# Patient Record
Sex: Female | Born: 1960 | Race: Black or African American | Hispanic: No
Health system: Southern US, Academic
[De-identification: ages and names within clinical notes are randomized; demographics above are authoritative.]

## PROBLEM LIST (undated history)

## (undated) DIAGNOSIS — I10 Essential (primary) hypertension: Secondary | ICD-10-CM

## (undated) DIAGNOSIS — E785 Hyperlipidemia, unspecified: Secondary | ICD-10-CM

## (undated) DIAGNOSIS — F411 Generalized anxiety disorder: Secondary | ICD-10-CM

## (undated) DIAGNOSIS — F32A Depression, unspecified: Secondary | ICD-10-CM

## (undated) DIAGNOSIS — I2510 Atherosclerotic heart disease of native coronary artery without angina pectoris: Secondary | ICD-10-CM

## (undated) HISTORY — DX: Depression, unspecified: F32.A

## (undated) HISTORY — DX: Essential (primary) hypertension: I10

## (undated) HISTORY — DX: Generalized anxiety disorder: F41.1

## (undated) HISTORY — PX: HX COLONOSCOPY: 2100001147

## (undated) HISTORY — DX: Hyperlipidemia, unspecified: E78.5

## (undated) HISTORY — PX: HX GALL BLADDER SURGERY/CHOLE: SHX55

## (undated) HISTORY — PX: HX ROTATOR CUFF REPAIR: SHX139

## (undated) HISTORY — PX: SPLENECTOMY, TOTAL: SHX788

## (undated) HISTORY — DX: Atherosclerotic heart disease of native coronary artery without angina pectoris: I25.10

## (undated) HISTORY — PX: HX NEPHRECTOMY: SHX65

---
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
## 2014-10-18 HISTORY — PX: HX ROTATOR CUFF REPAIR: SHX139

## 2015-07-29 HISTORY — PX: CARDIAC CATHETERIZATION: SHX172

## 2019-03-31 IMAGING — US ABD LIMITED
1 series · 14 of 25 positions shown · non-contrast
Comparison: 10/04/2014.

EXAM:  KALLUDRA [employer]ESSION[employer] READ ABD U/S LMTD
INDICATION: Abdominal pain.

[Series 1: abd limited · 14 of 49 slices shown]
[im 1/49]
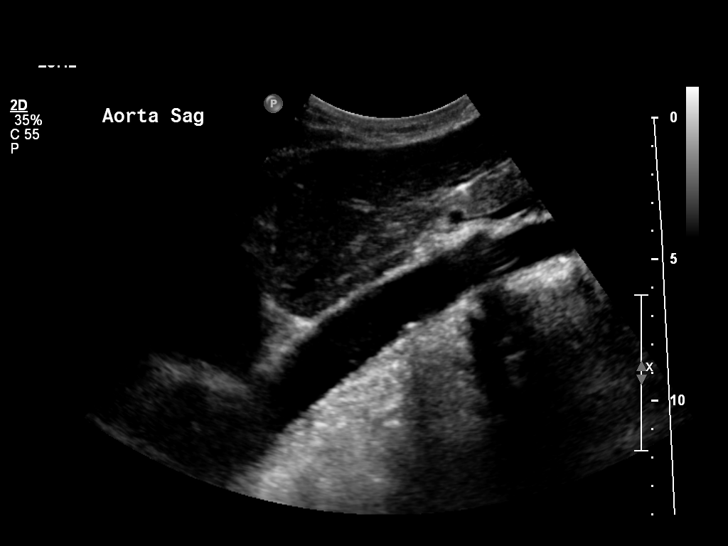
[im 5/49]
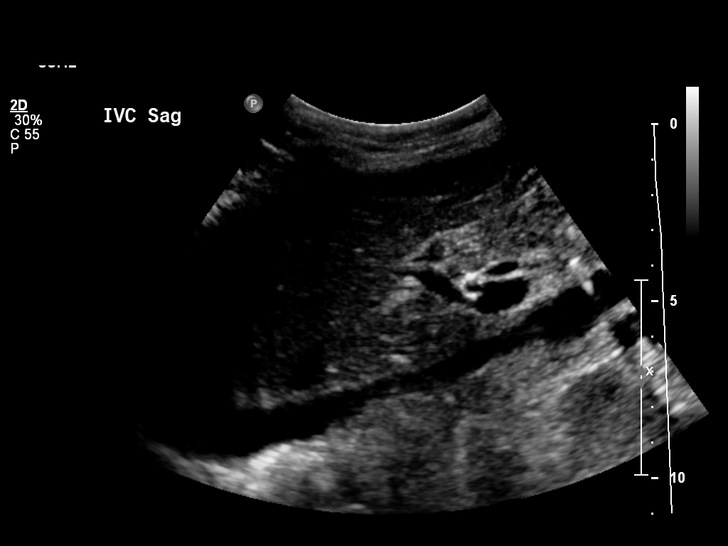
[im 9/49]
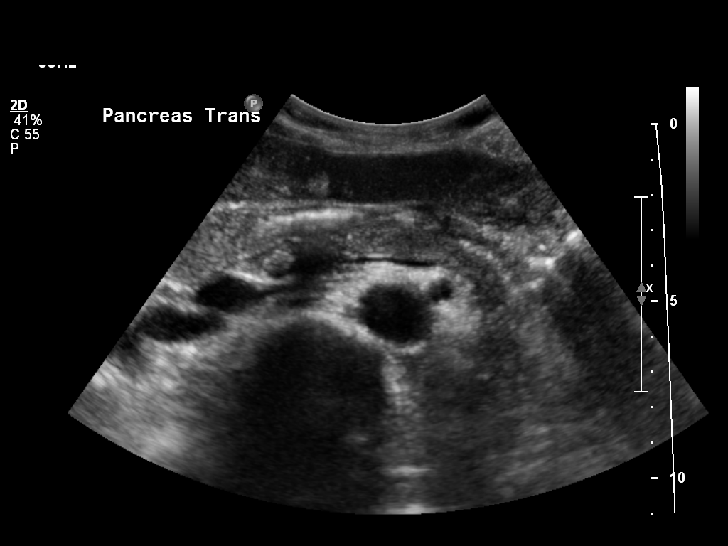
[im 13/49]
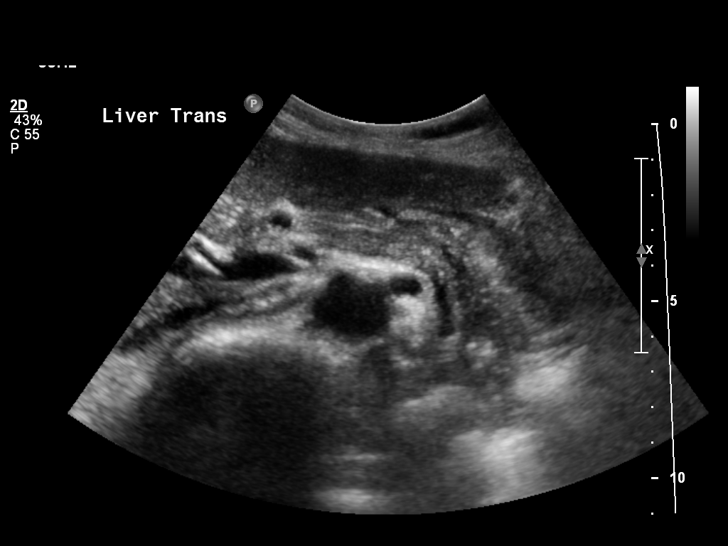
[im 17/49]
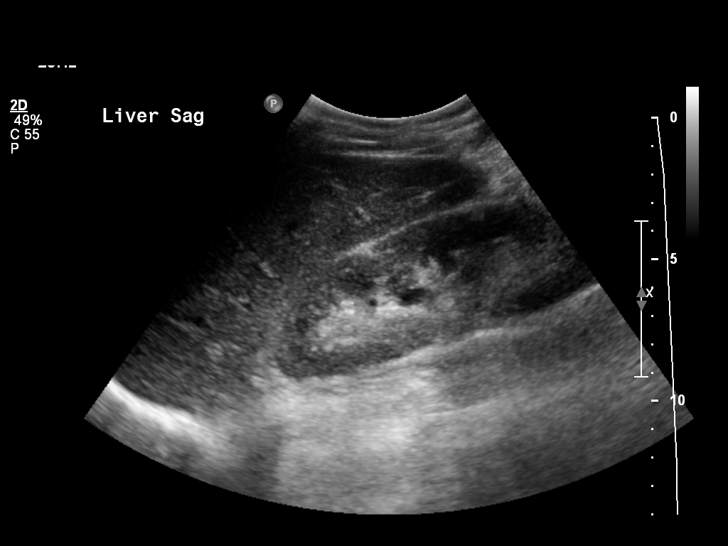
[im 19/49]
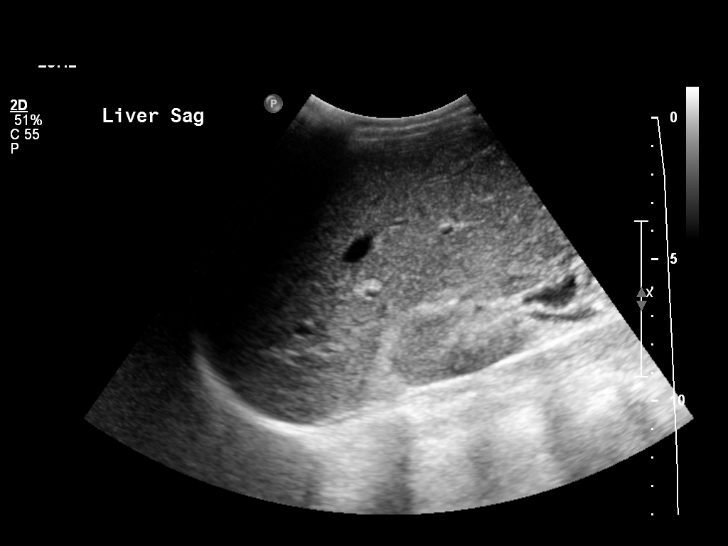
[im 23/49]
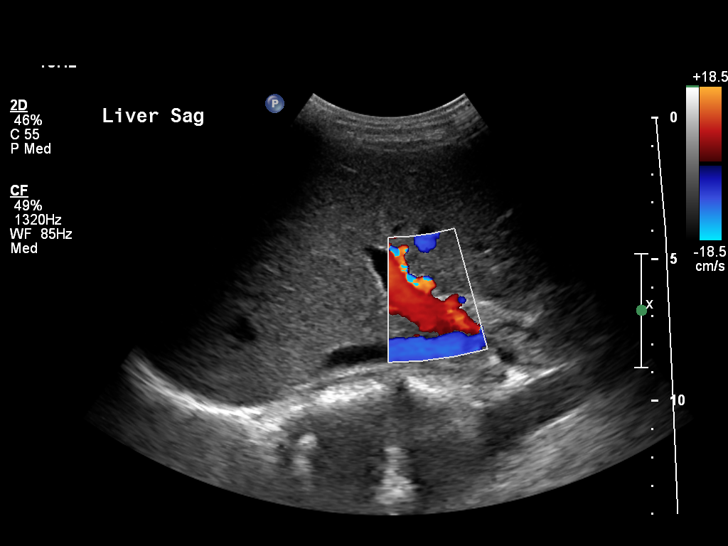
[im 27/49]
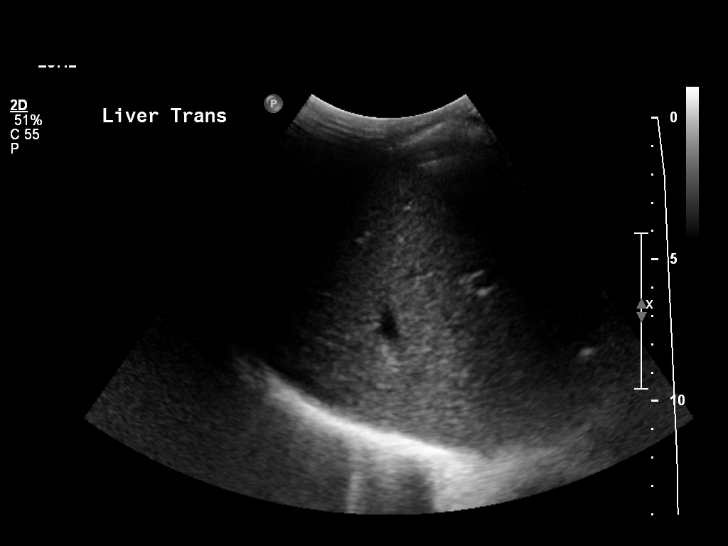
[im 31/49]
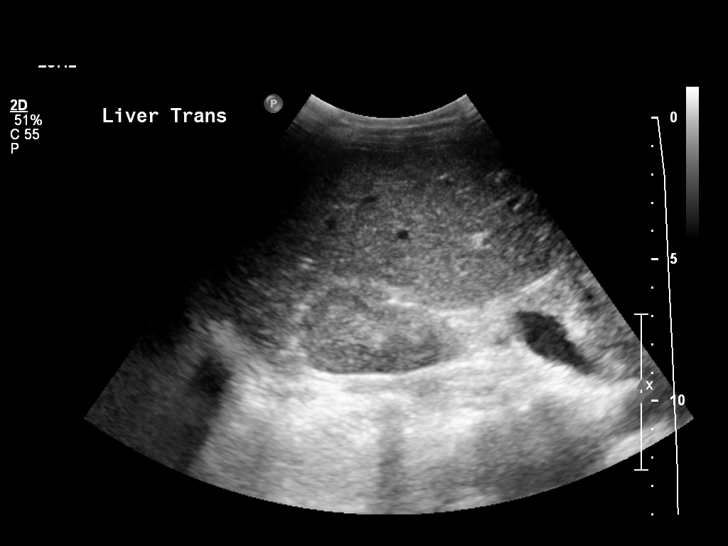
[im 33/49]
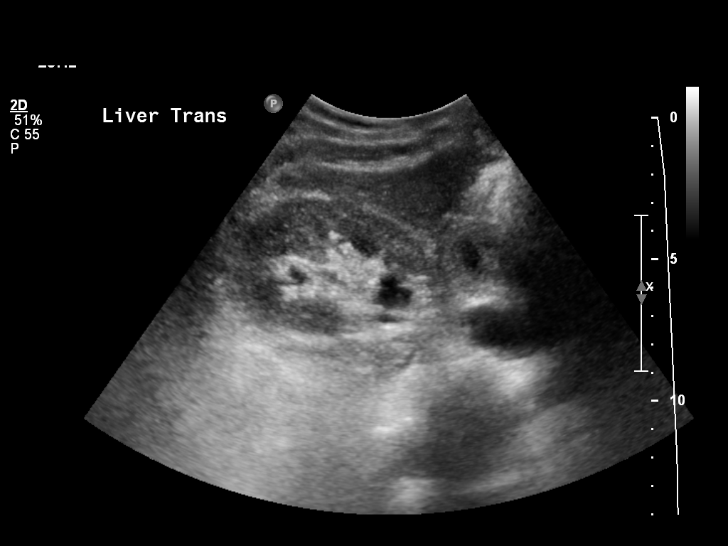
[im 37/49]
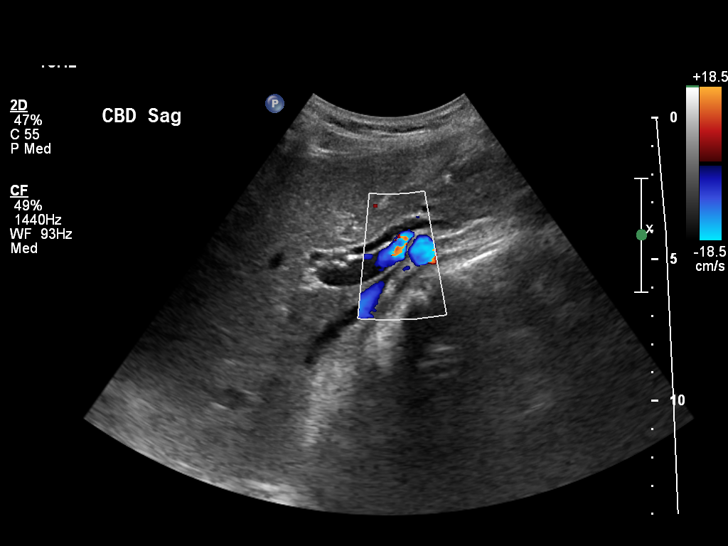
[im 41/49]
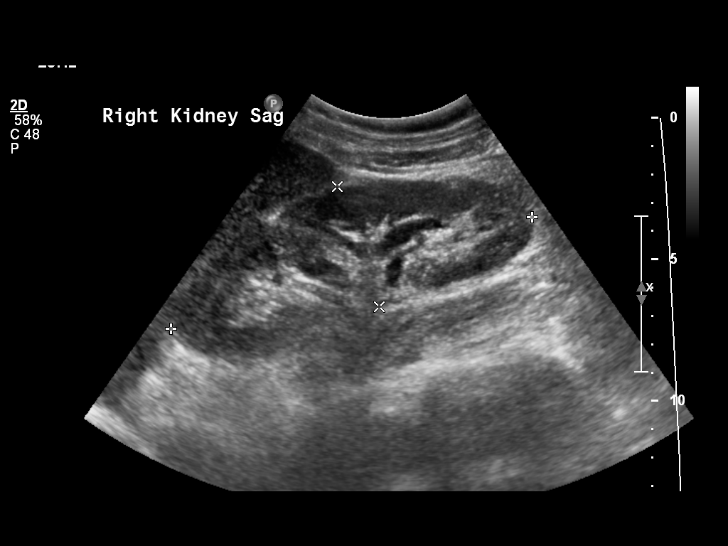
[im 45/49]
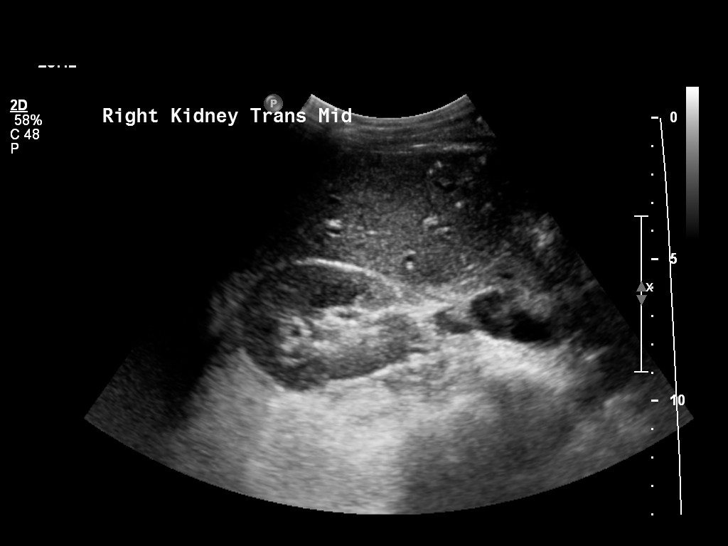
[im 49/49]
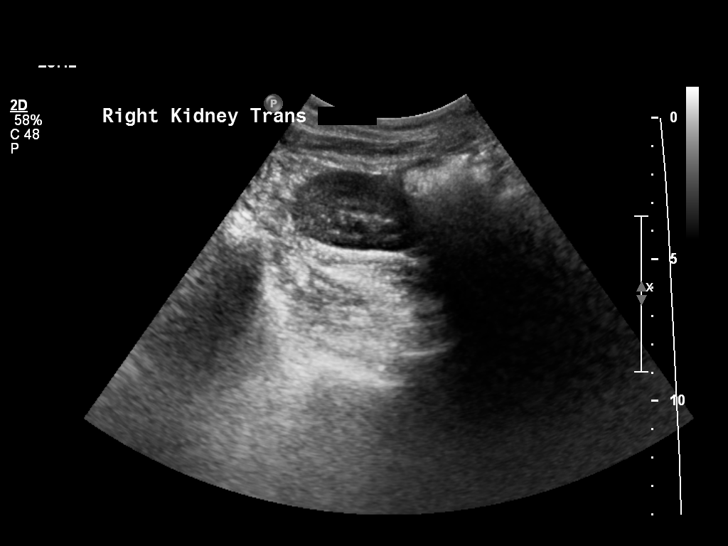

[14 of 25 positions shown; findings below may reference images not displayed]

FINDINGS: Liver is normal in echogenicity. There is no hepatic mass. There is no intra or extrahepatic biliary ductal dilatation. Common bile duct measures 3 mm. Gallbladder is surgically absent. Pancreas is unremarkable. Right kidney measures 13.5 cm and is normal.

Visualized abdominal aorta is without aneurysmal dilatation. IVC is normal. Portal vein measures 10 mm in diameter and demonstrates hepatopetal flow. Hepatic veins are also patent. There is no ascites.
IMPRESSION: 1. Unremarkable liver. 

2. Prior cholecystectomy.

## 2019-04-27 IMAGING — CR XRAY THORACIC SPINE 2 VIEWS
1 series · 3 of 3 positions shown · non-contrast
Comparison: None available.

EXAM:  XRAY THORACIC SPINE 2 VIEWS,

EXAM:  XRAY LUMBAR SPINE [DATE] VIEWS
INDICATION: Back pain.

[Series 1: view not recorded · 0.17mm/px · 3 of 3 slices shown]
[im 1/3]
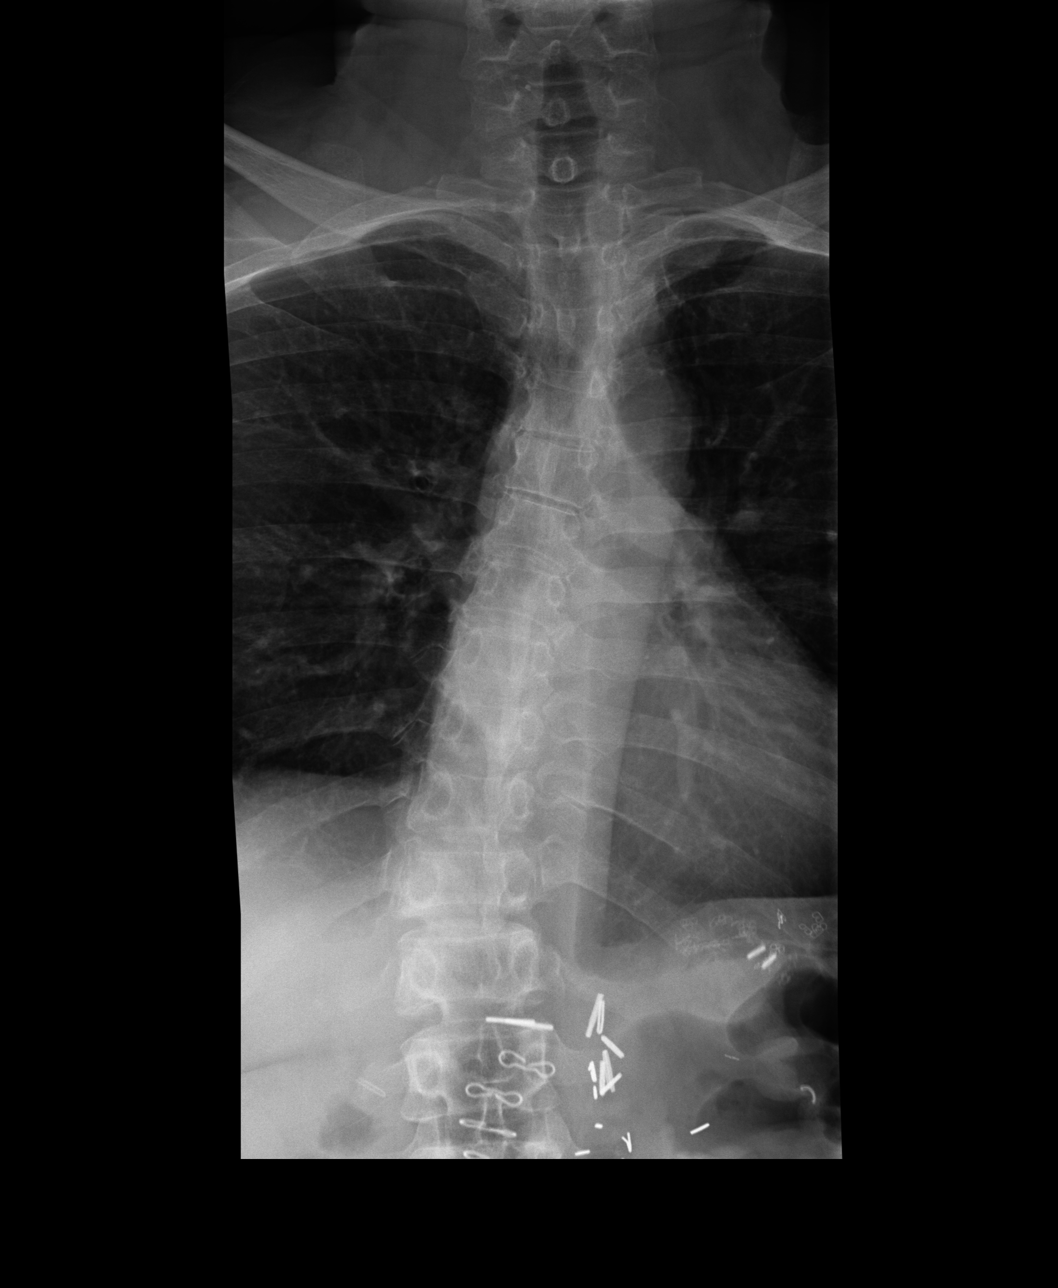
[im 2/3]
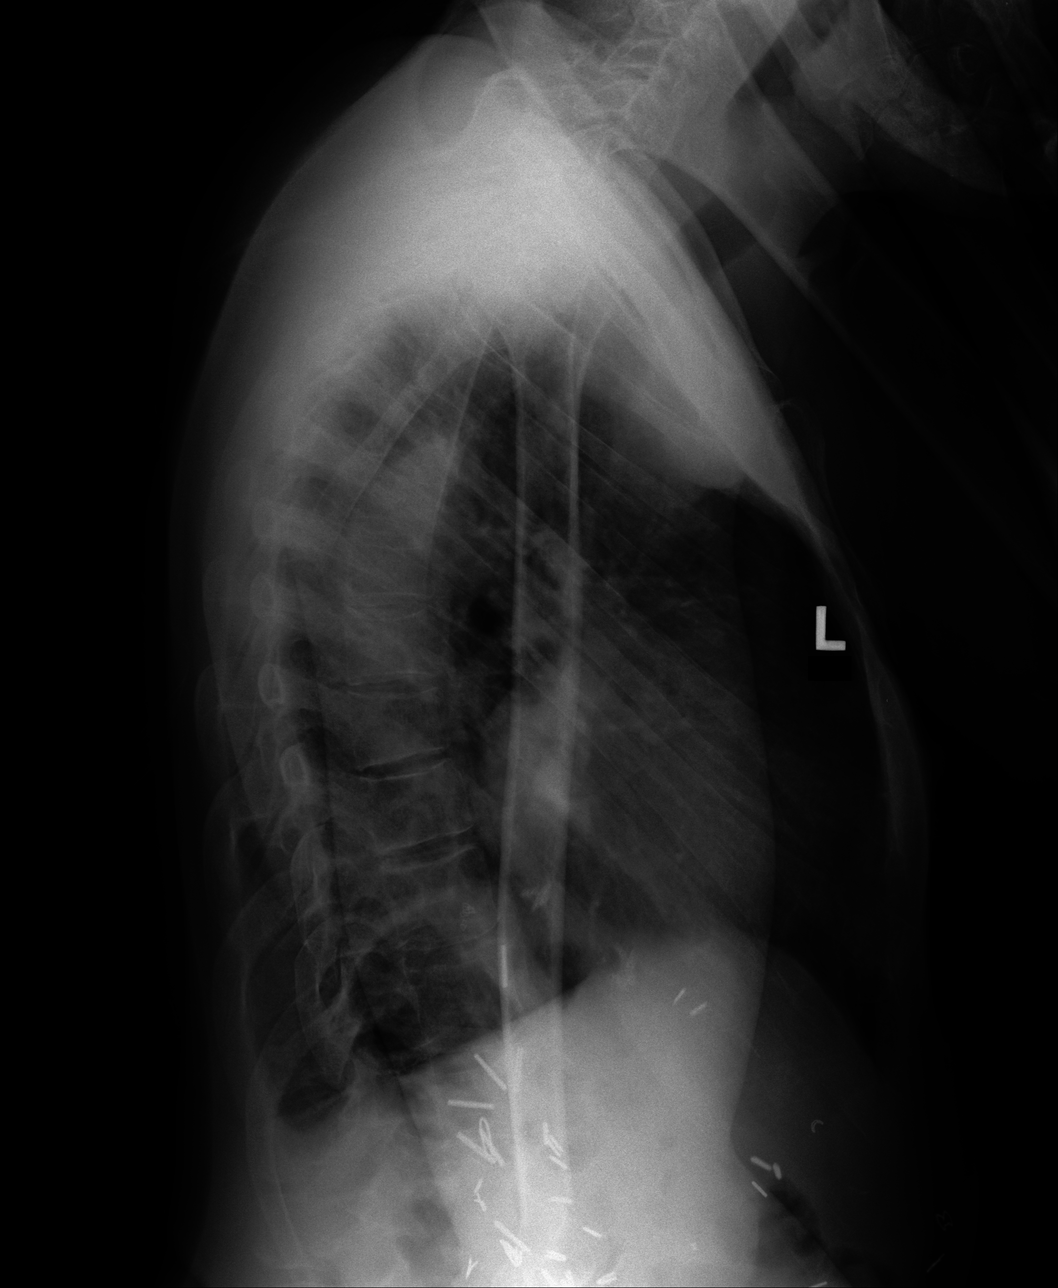
[im 3/3]
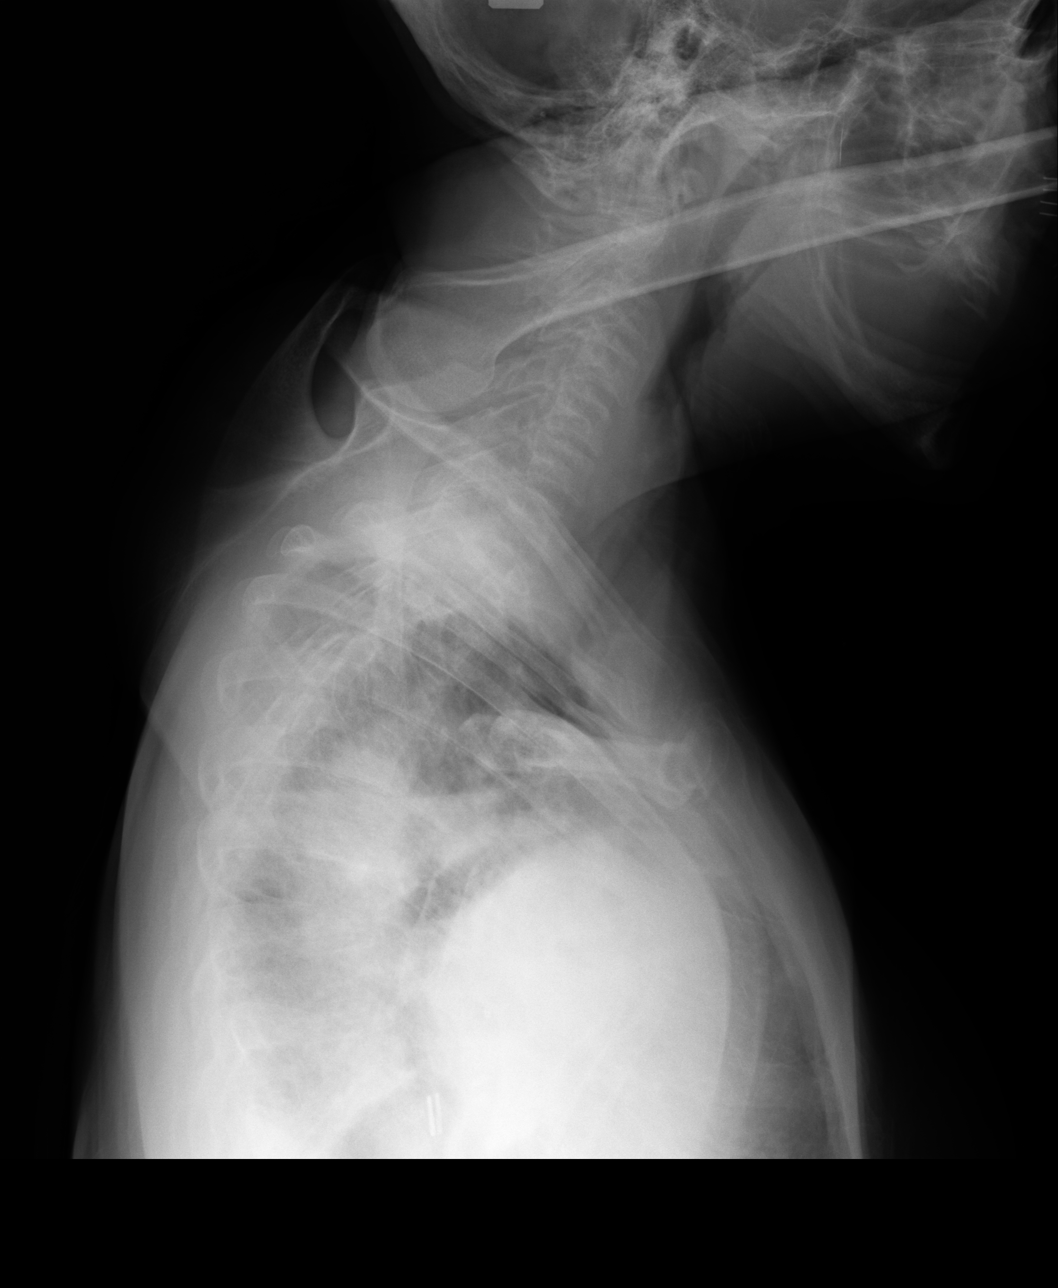

[3 of 3 positions shown; findings below may reference images not displayed]

FINDINGS: There is a mild dextroscoliosis centered at the thoracolumbar junction. No acute fracture or subluxation is seen. There are no significant arthritic changes within the thoracic spine. There is moderate degenerative disc disease at L5-S1 level. Minimal anterolisthesis of L4 on L5 vertebral body is also likely degenerative. There is also moderate facet arthropathy within the lower lumbar spine. Numerous surgical staples are seen throughout the abdomen.
IMPRESSION: Mild dextroscoliosis centered at the thoracolumbar junction. 

Multilevel degenerative changes of the lumbar spine as detailed above. 

No significant degenerative changes of the thoracic spine.

## 2019-04-27 IMAGING — CR XRAY LUMBAR SPINE [DATE] VIEWS
1 series · 3 of 3 positions shown · non-contrast
Comparison: None available.

EXAM:  XRAY THORACIC SPINE 2 VIEWS,

EXAM:  XRAY LUMBAR SPINE [DATE] VIEWS
INDICATION: Back pain.

[Series 3: view not recorded · 0.17mm/px · 3 of 3 slices shown]
[im 1/3]
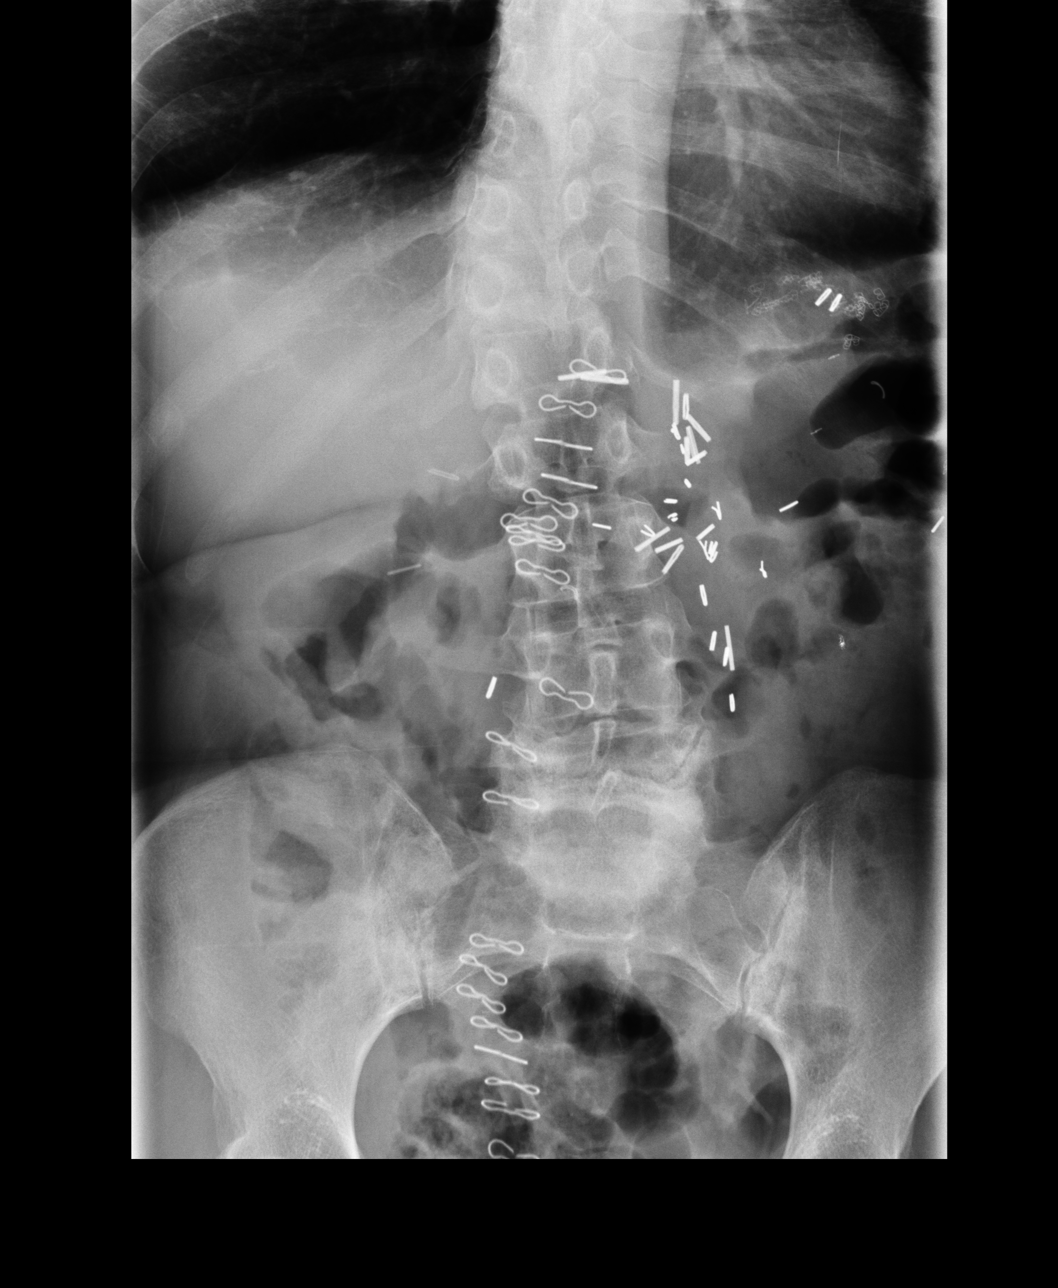
[im 2/3]
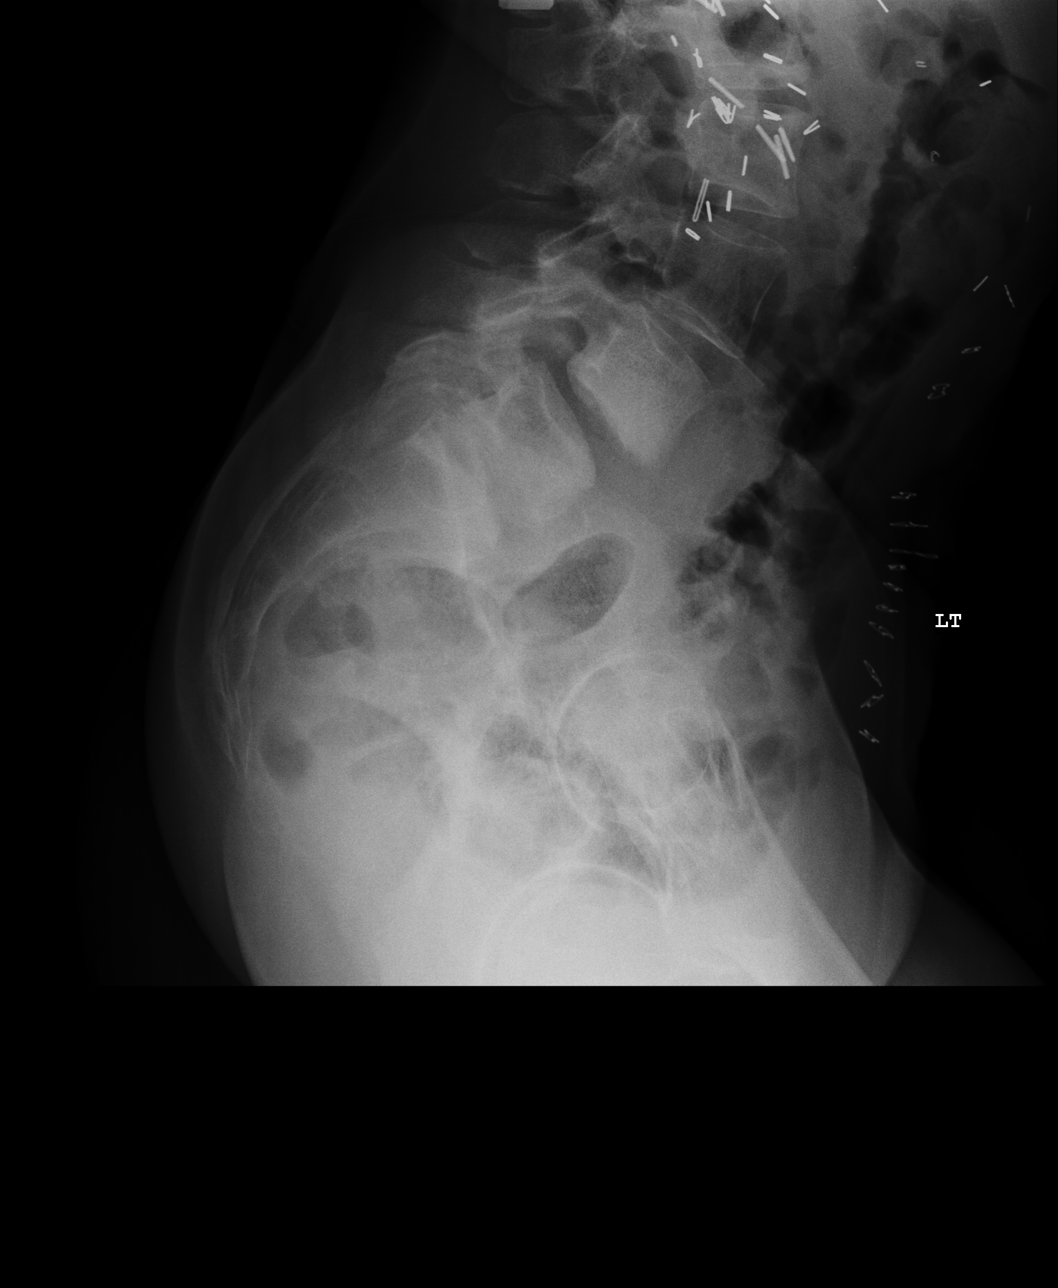
[im 3/3]
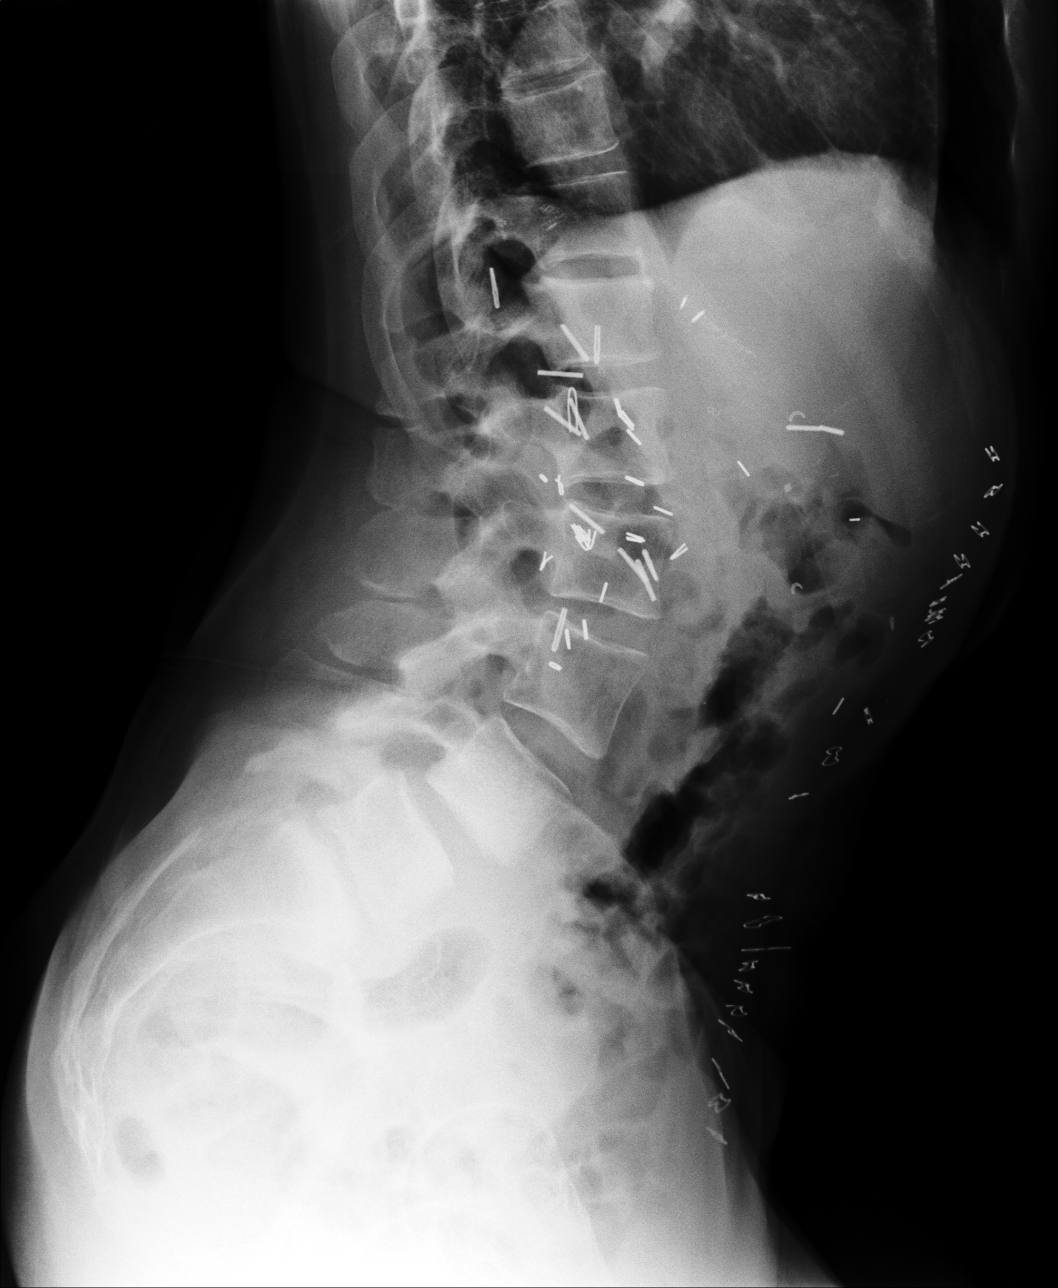

[3 of 3 positions shown; findings below may reference images not displayed]

FINDINGS: There is a mild dextroscoliosis centered at the thoracolumbar junction. No acute fracture or subluxation is seen. There are no significant arthritic changes within the thoracic spine. There is moderate degenerative disc disease at L5-S1 level. Minimal anterolisthesis of L4 on L5 vertebral body is also likely degenerative. There is also moderate facet arthropathy within the lower lumbar spine. Numerous surgical staples are seen throughout the abdomen.
IMPRESSION: Mild dextroscoliosis centered at the thoracolumbar junction. 

Multilevel degenerative changes of the lumbar spine as detailed above. 

No significant degenerative changes of the thoracic spine.

## 2019-06-28 IMAGING — CR XRAY LUMBAR SPINE COMPLETE
1 series · 3 of 3 positions shown · non-contrast
Comparison: Radiographs dated 04/27/2019.

EXAM:  XRAY LUMBAR SPINE COMPLETE
INDICATION: Lower back pain with radiculopathy.

[Series 2: view not recorded · 0.17mm/px · 3 of 3 slices shown]
[im 1/3]
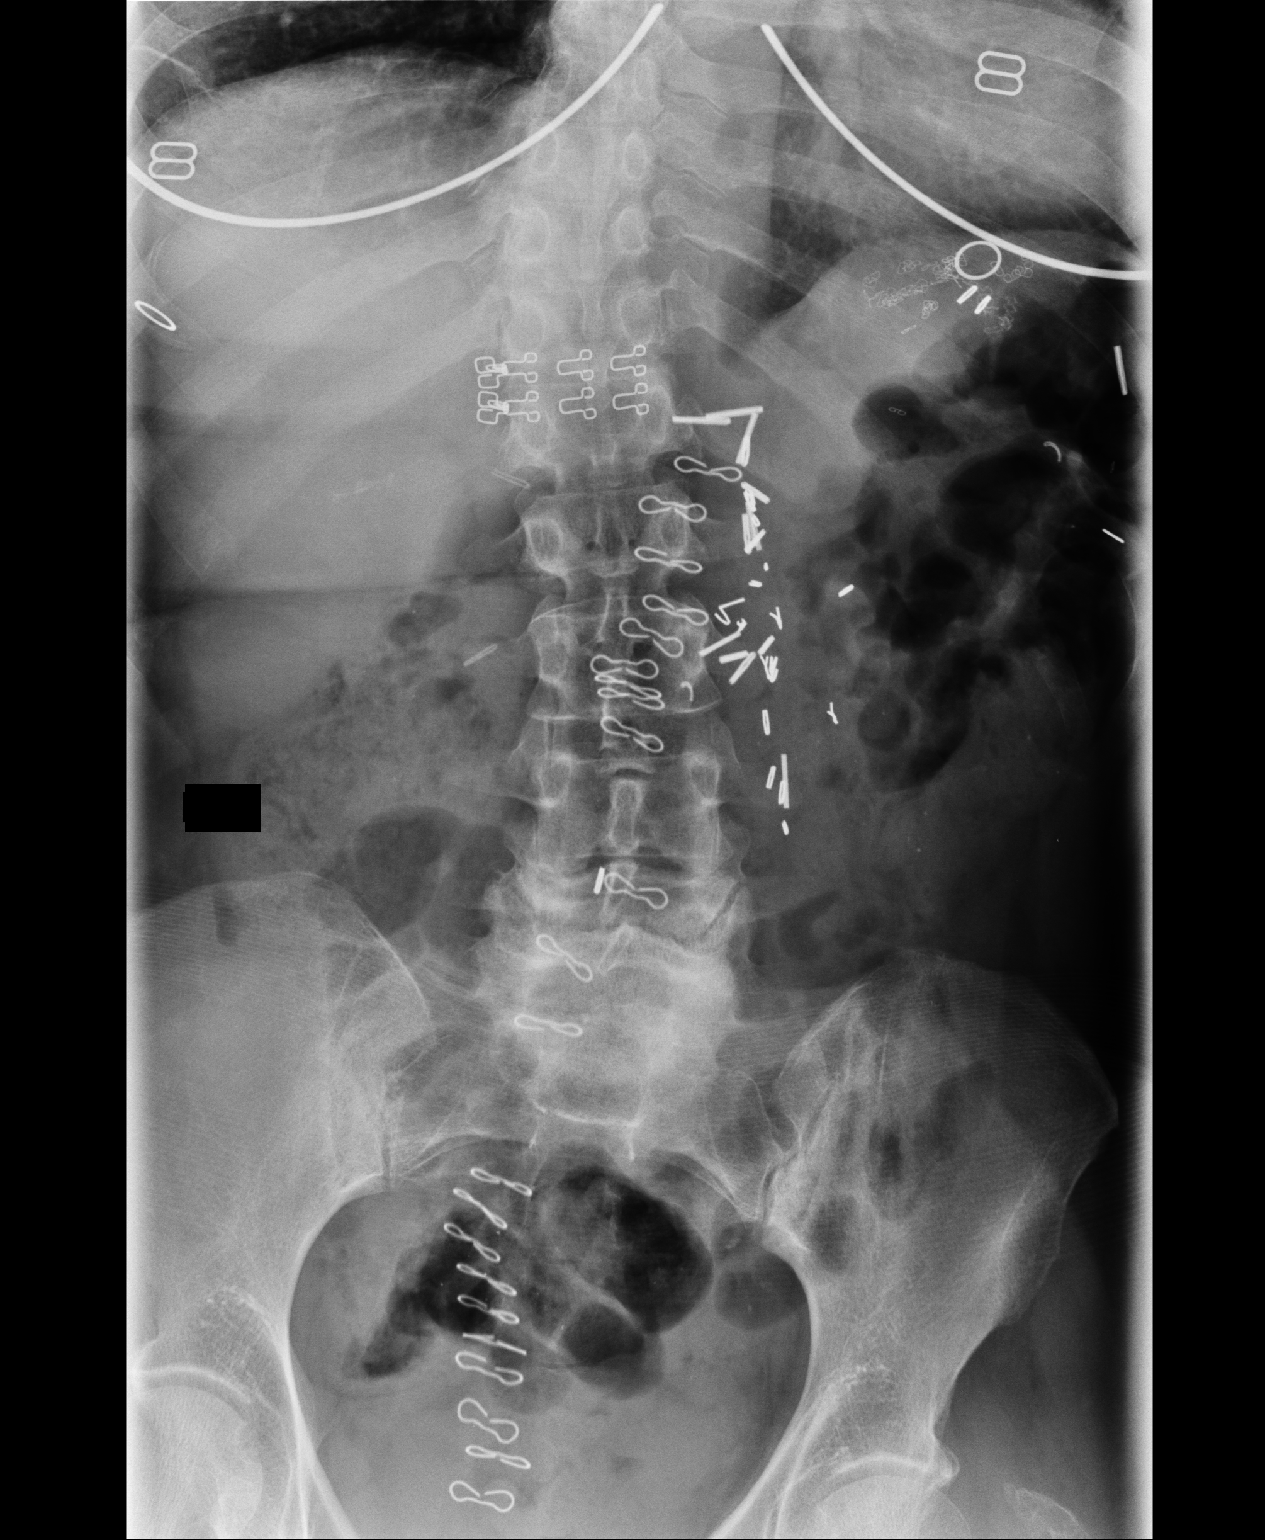
[im 2/3]
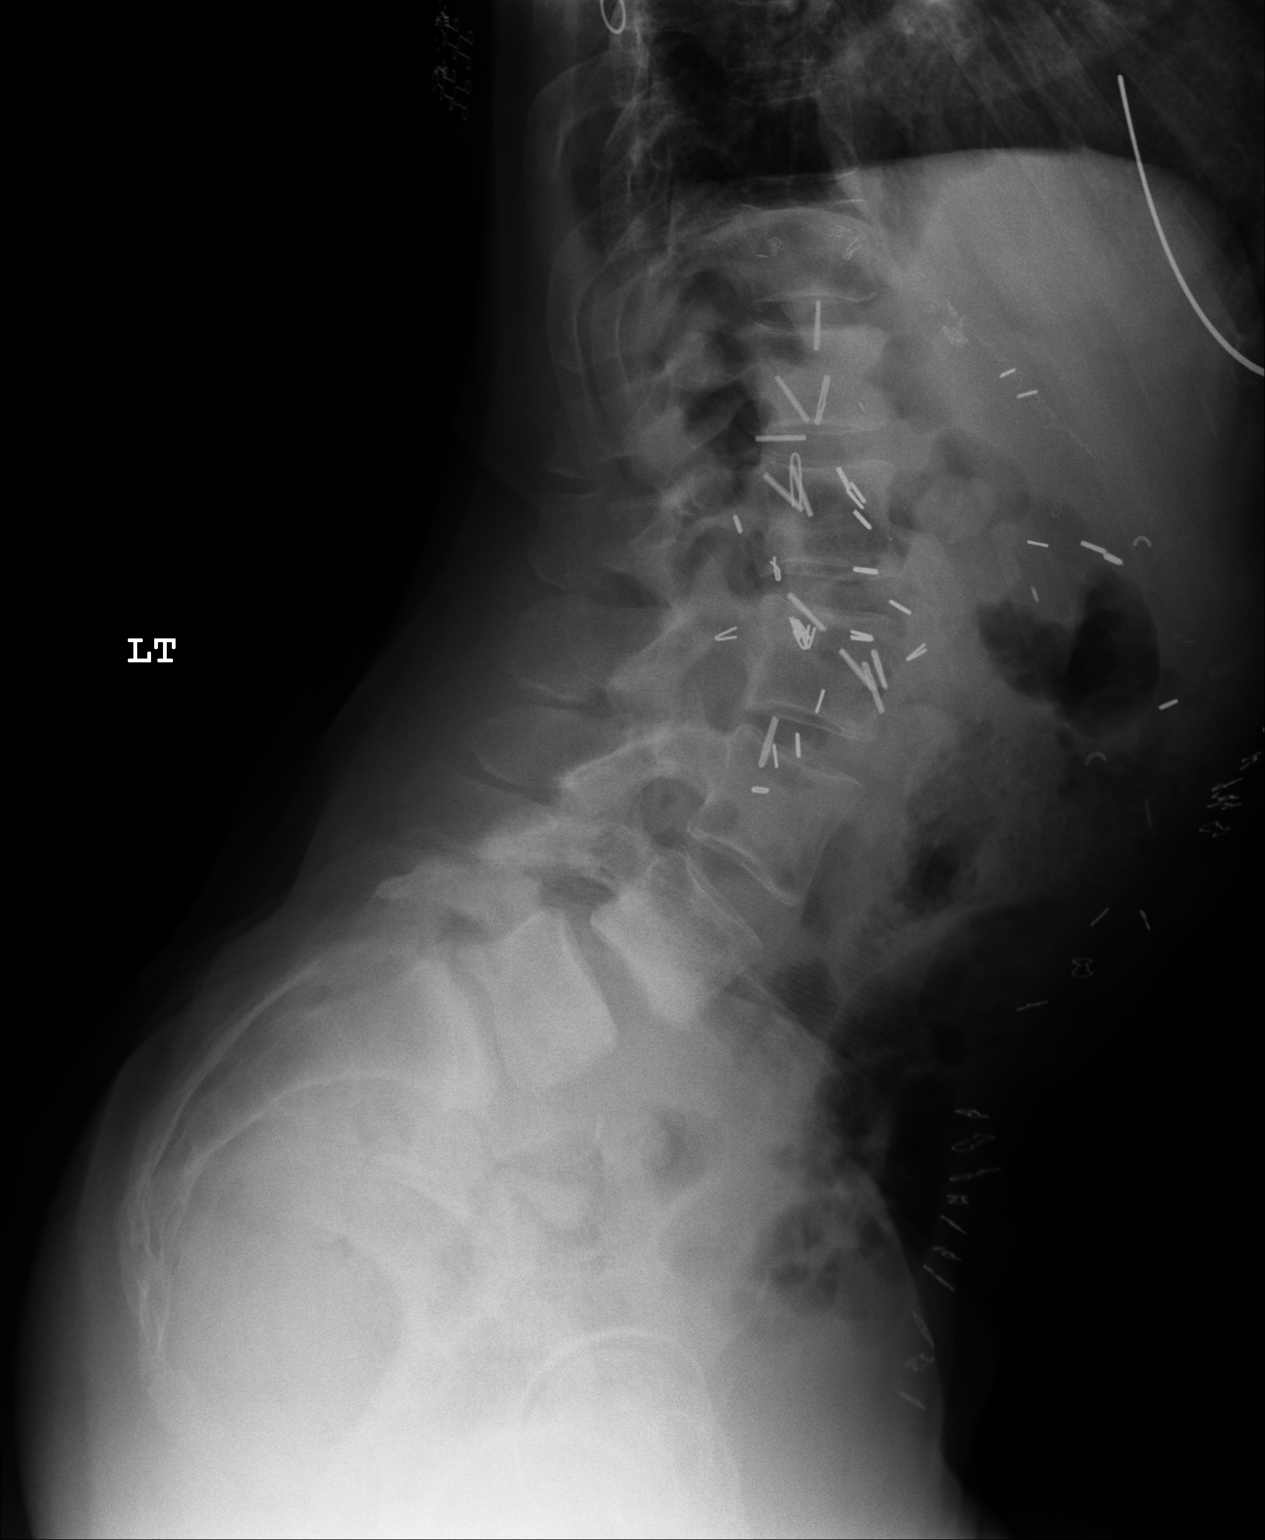
[im 3/3]
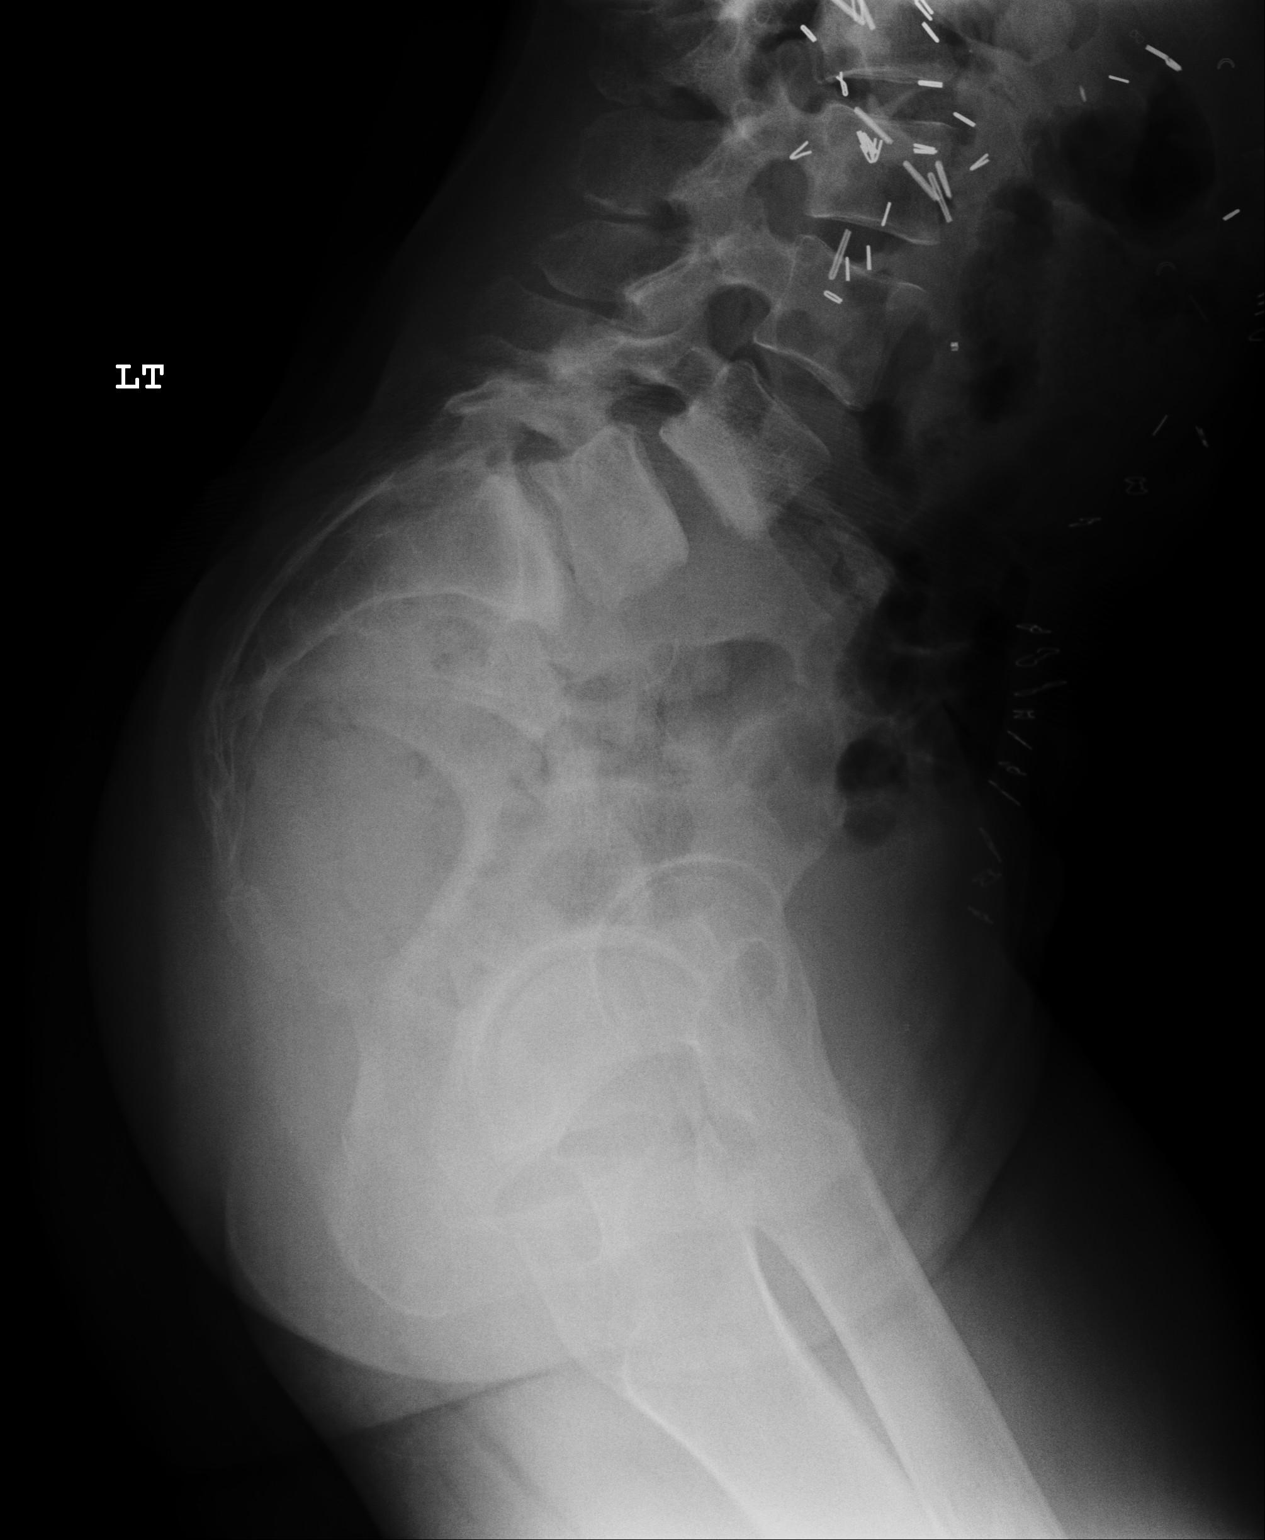

[3 of 3 positions shown; findings below may reference images not displayed]

FINDINGS: There is no acute fracture or subluxation. Moderate degenerative disc disease at L5-S1 level is again noted. Minimal anterolisthesis of L4 on L5 vertebral body is also stable. Moderate facet arthropathy within the lower lumbar spine is also again seen. Paraspinal soft tissues are unremarkable. Extensive postsurgical changes of the abdomen are again identified.
IMPRESSION: Stable degenerative changes as detailed above.

## 2019-06-28 IMAGING — CR XRAY HIP W/PELVIS UNIL 2-3 VIEWS
1 series · 2 of 2 positions shown · non-contrast
Comparison: None available.

EXAM:  XRAY HIP W/PELVIS UNIL 2-3 VIEWS
INDICATION: Pelvic and right hip pain.

[Series 1: view not recorded · 0.17mm/px · 2 of 2 slices shown]
[im 1/2]
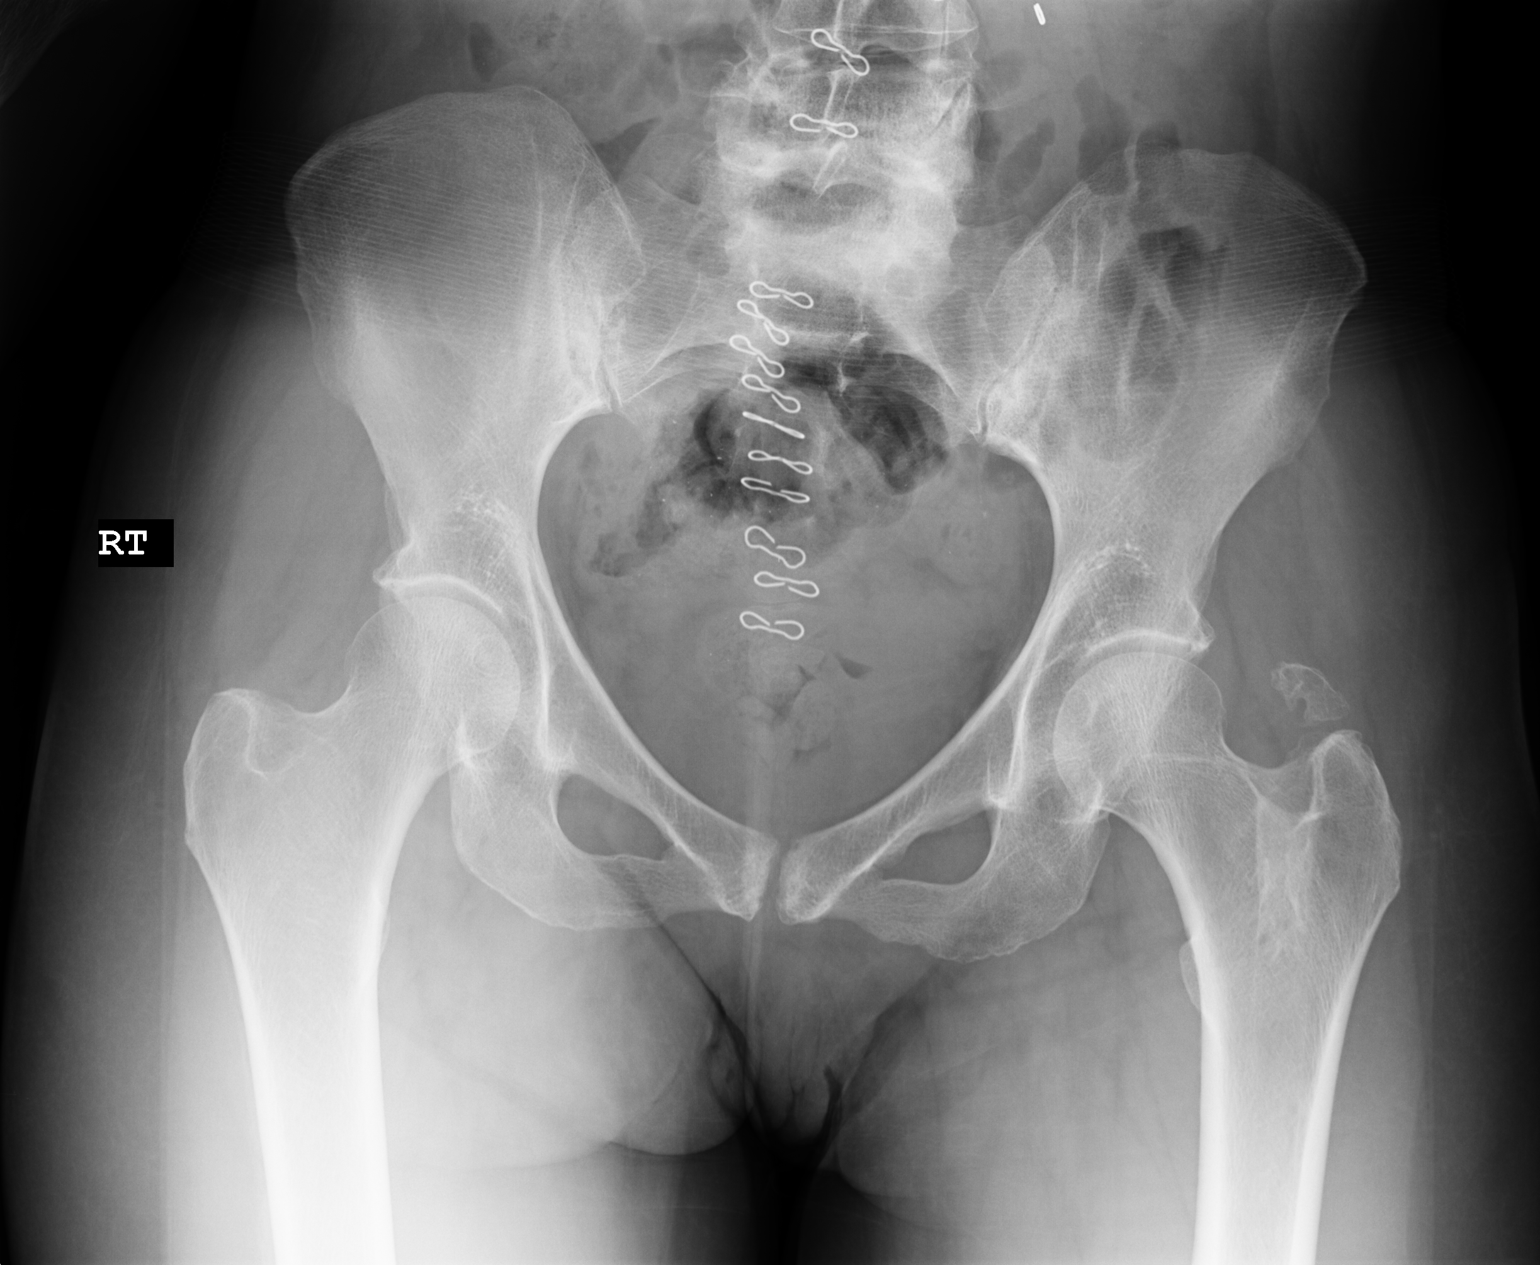
[im 2/2]
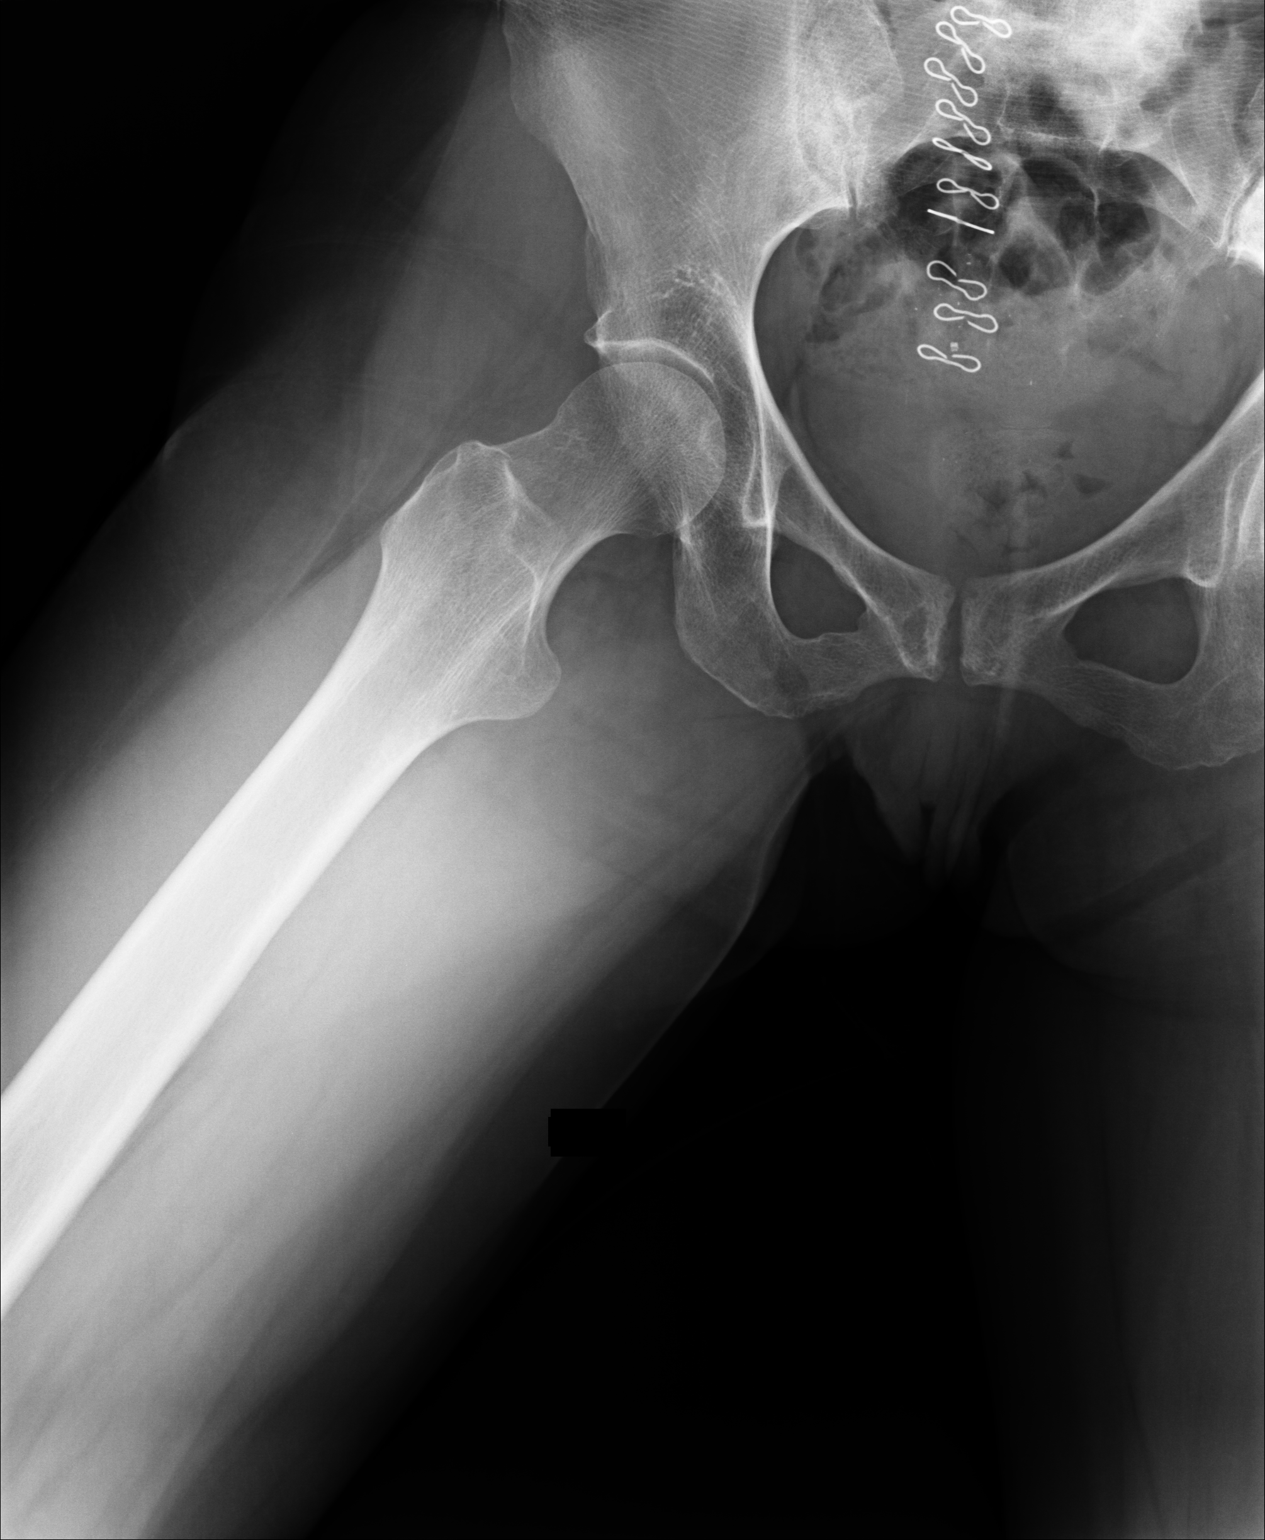

[2 of 2 positions shown; findings below may reference images not displayed]

FINDINGS: There is no acute fracture or subluxation. No significant arthritic changes seen within the hip joints. Well corticated calcification adjacent to the left greater trochanter is likely from remote trauma. Surrounding soft tissues are unremarkable.
IMPRESSION: Unremarkable exam.

## 2020-08-08 IMAGING — MG 2D SCREENING MAMMO BIL W/CAD
1 series · 4 of 4 positions shown · non-contrast
Comparison: 06/03/2020 and 12/15/2018.

------------- REPORT [id number] -------------
[facility] [place]
[address]
Daina Ms.TOLMAY, MMAKWENA:
We wish to report the following on your recent mammography examination. We are sending a report to your referring physician or other health care provider. 
(       Normal/Negative:
No evidence of cancer.
This statement is mandated by the [place], Department of Health.
Your examination was performed by one of our technologists, who are registered radiological technologists and also specially certified in mammography:
___
Parlak, Edaly (M)
Nepomuceno, Martinez (M)

Your mammogram was interpreted by our radiologist.
( 
Sofeine Made, M.D.
(Annual Breast Examination by a physician or other health care provider
(Annual Mammography Screening beginning at age 40
(Monthly Breast Self Examination
------------- REPORT [id number] -------------
﻿
                         #:
EXAM:  2D BILATERAL ANNUAL SCREENING DIGITAL MAMMOGRAM WITH CAD
INDICATION: Screening.

[R CC · right · 4 of 4 slices shown]
[im 1/4]
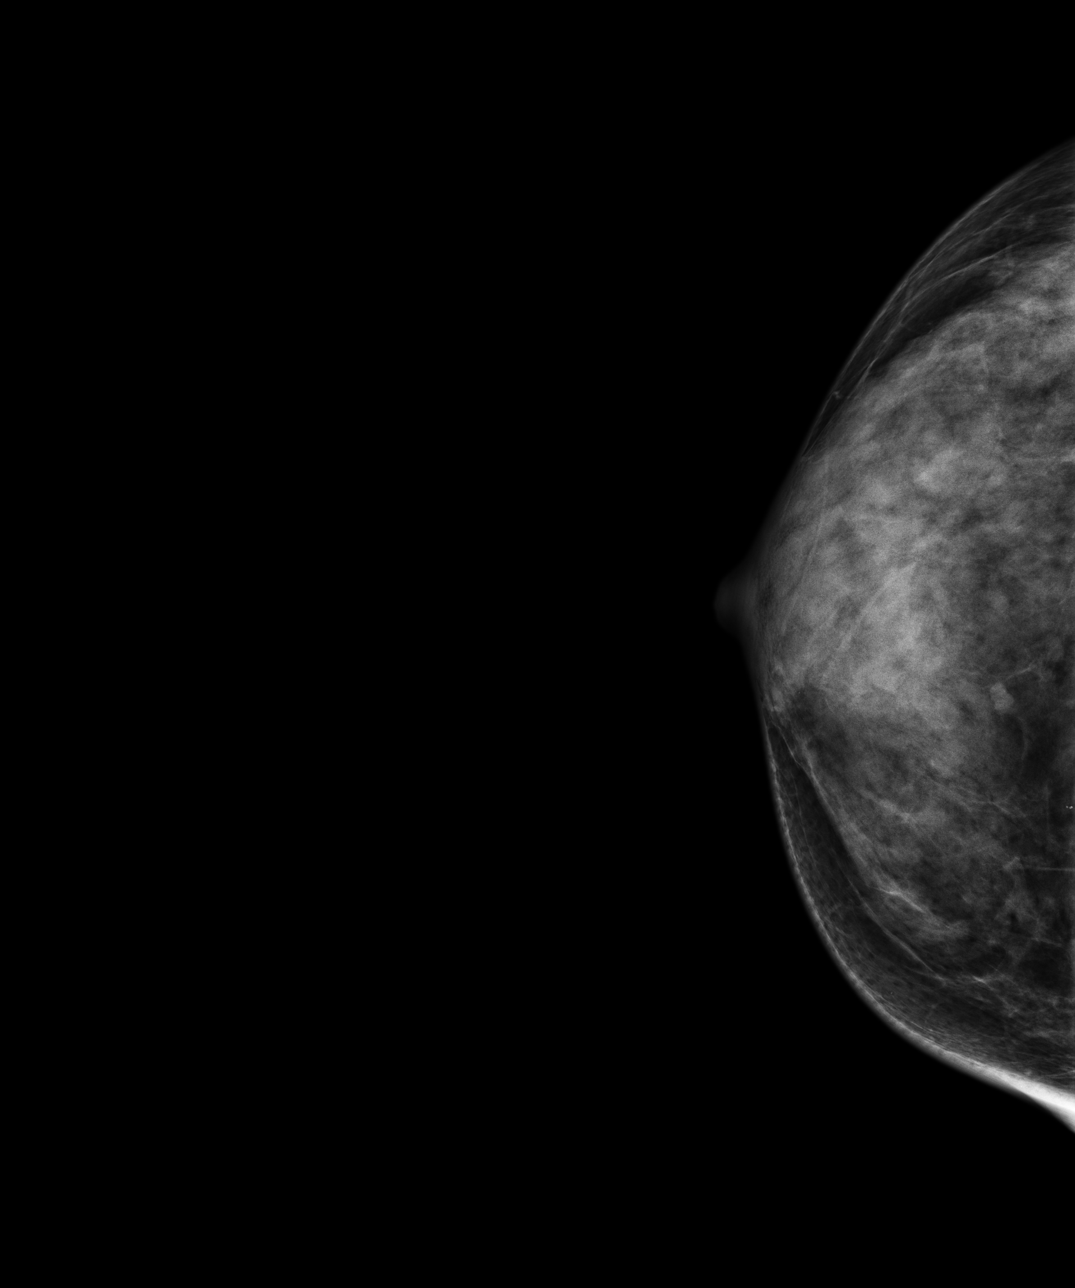
[im 2/4]
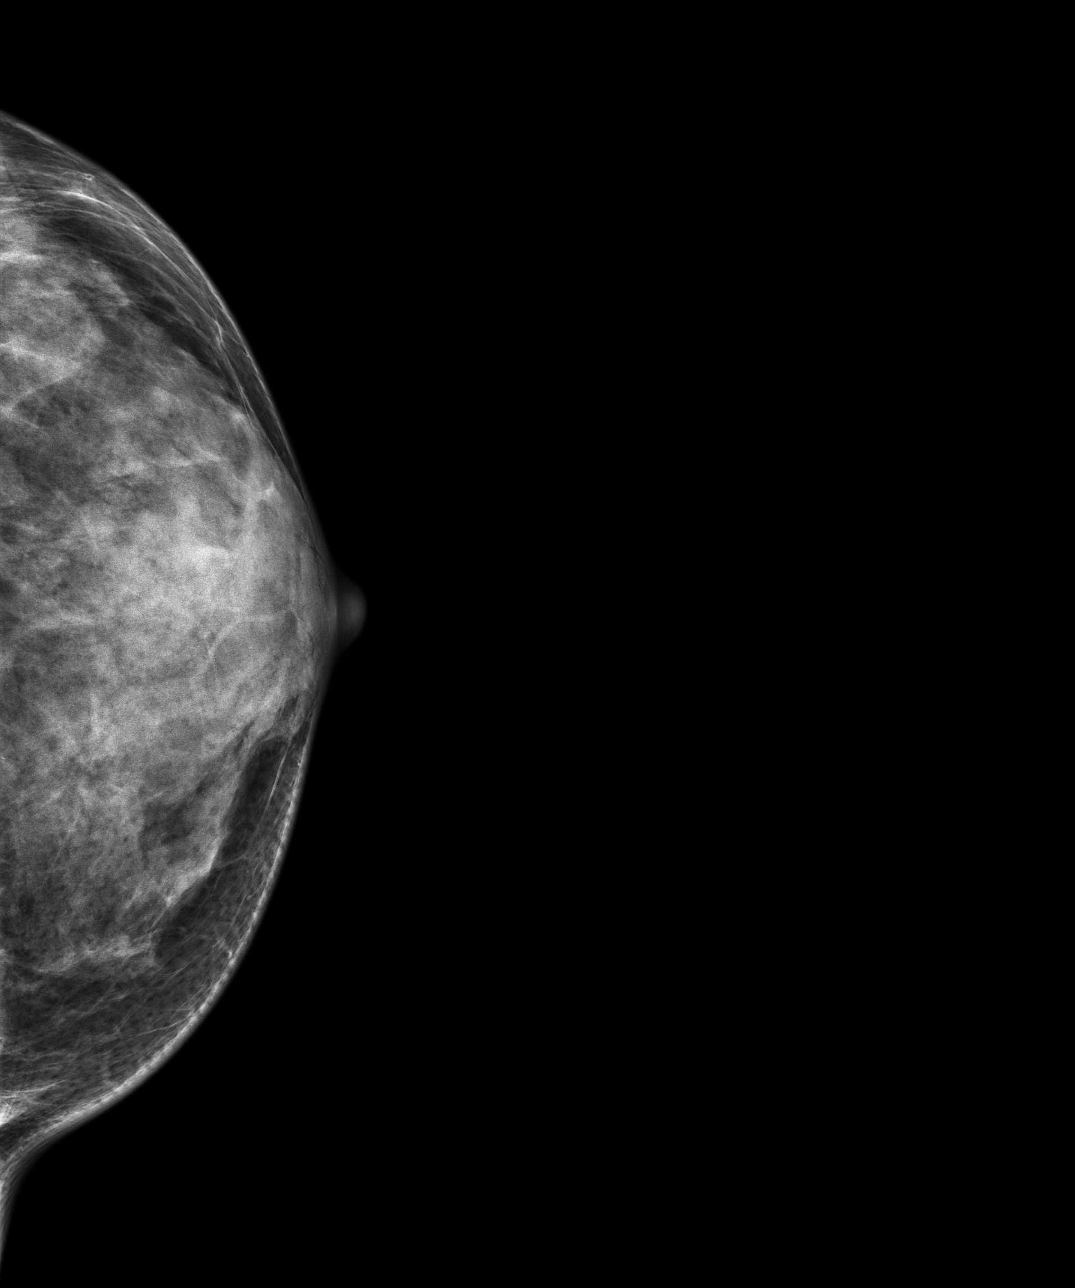
[im 3/4]
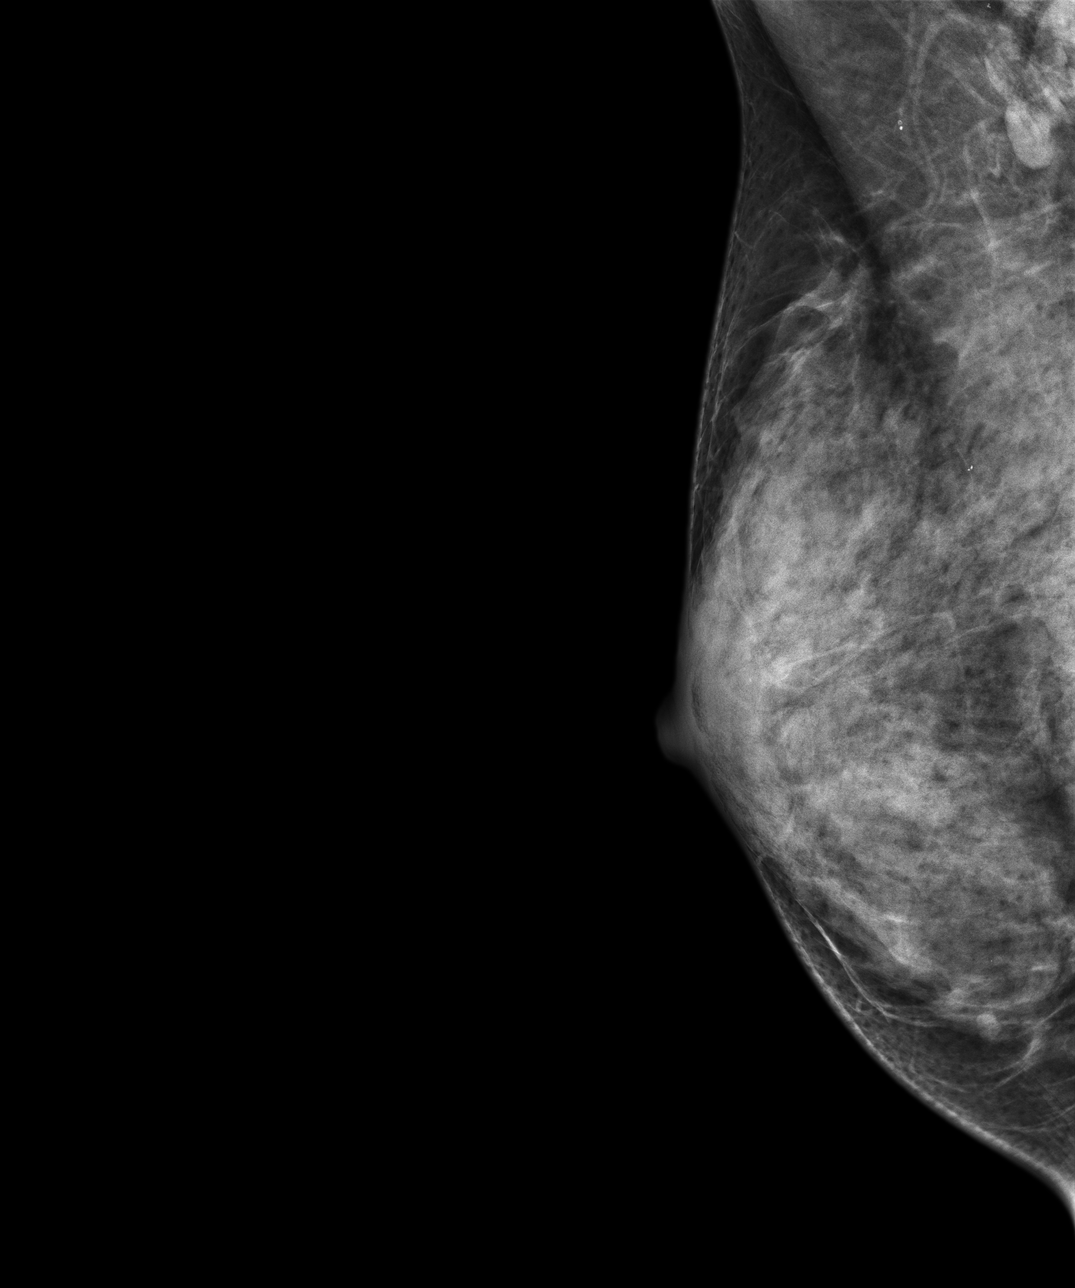
[im 4/4]
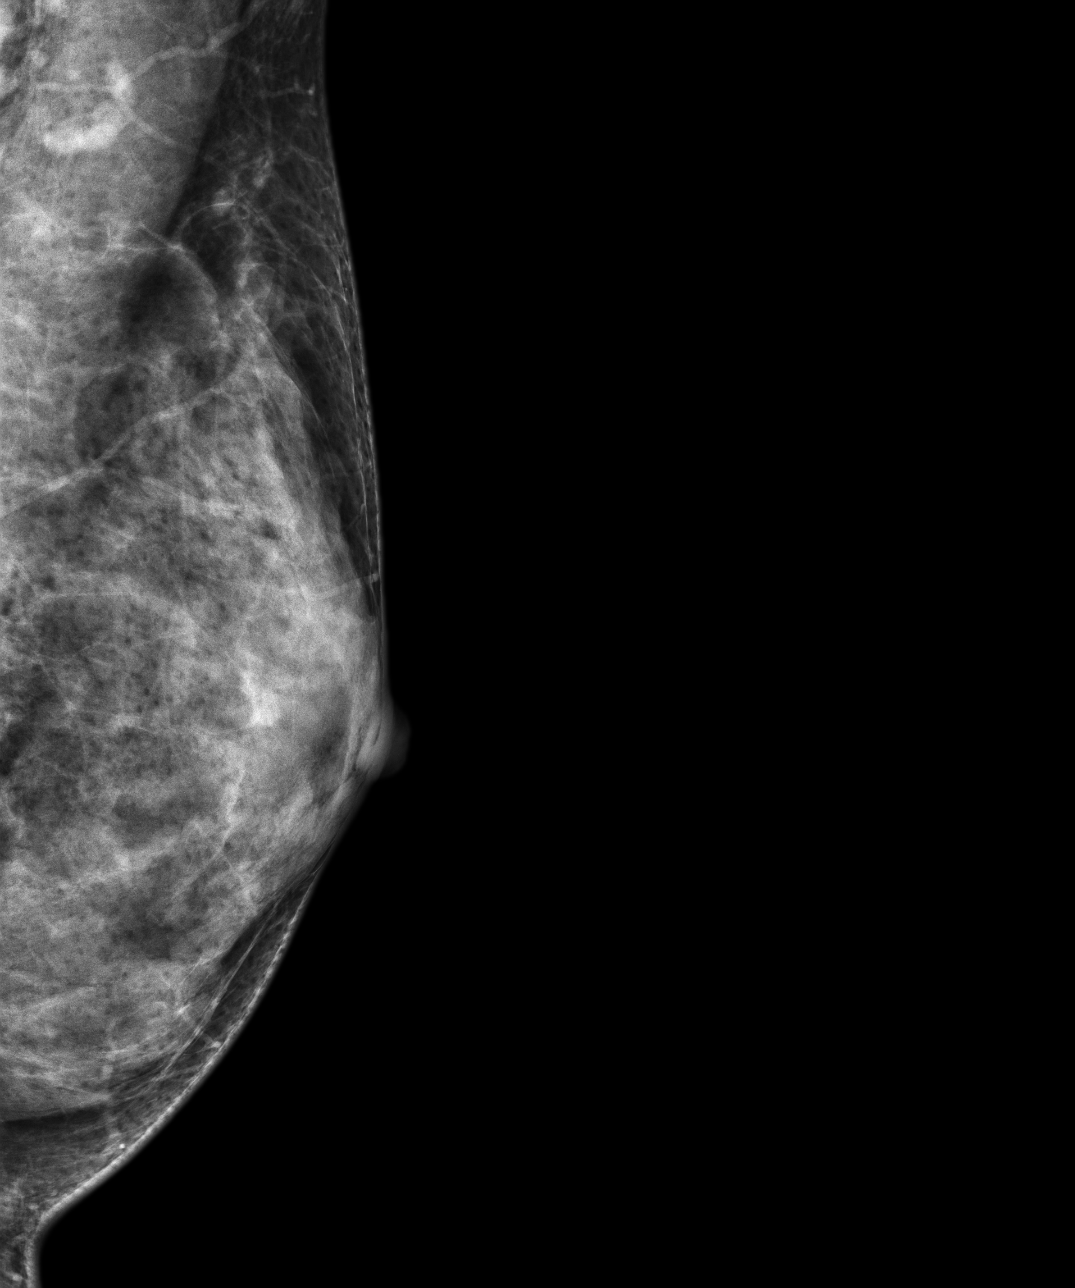

[4 of 4 positions shown; findings below may reference images not displayed]

FINDINGS: Breast parenchyma is extremely dense.  There is no mass or suspicious cluster of microcalcifications.  There is no architectural distortion, skin thickening or nipple retraction.
IMPRESSION: 1.  BIRADS 2-Benign findings. Patient has been added in a reminder system with a target date for the next screening mammography.

2.  DENSITY CODE – D (Extremely dense). 

Final Assessment Code:

Bi-Rads 2 

BI-RADS 0
Need additional imaging evaluation.

BI-RADS 1
Negative mammogram.

BI-RADS 2
Benign finding.

BI-RADS 3
Probably benign finding; short-interval follow-up suggested.

BI-RADS 4
Suspicious abnormality; biopsy should be considered.

BI-RADS 5
Highly suggestive of malignancy; appropriate action should be taken.

BI-RADS 6
Known biopsy-proven malignancy; appropriate action should be taken.

NOTE:
In compliance with Federal regulations, the results of this mammogram are being sent to the patient.

## 2021-08-11 IMAGING — MG 2D SCREENING MAMMO BIL W/CAD
1 series · 4 of 4 positions shown · non-contrast
Comparison: 07/14/2021

------------- REPORT [id number] -------------
﻿

BOAT, DAMAREO
EXAM:  2D BILATERAL ANNUAL SCREENING DIGITAL MAMMOGRAM WITH CAD
INDICATION: Screening.

[R CC · right · 0.10mm/px · 4 of 4 slices shown]
[im 1/4]
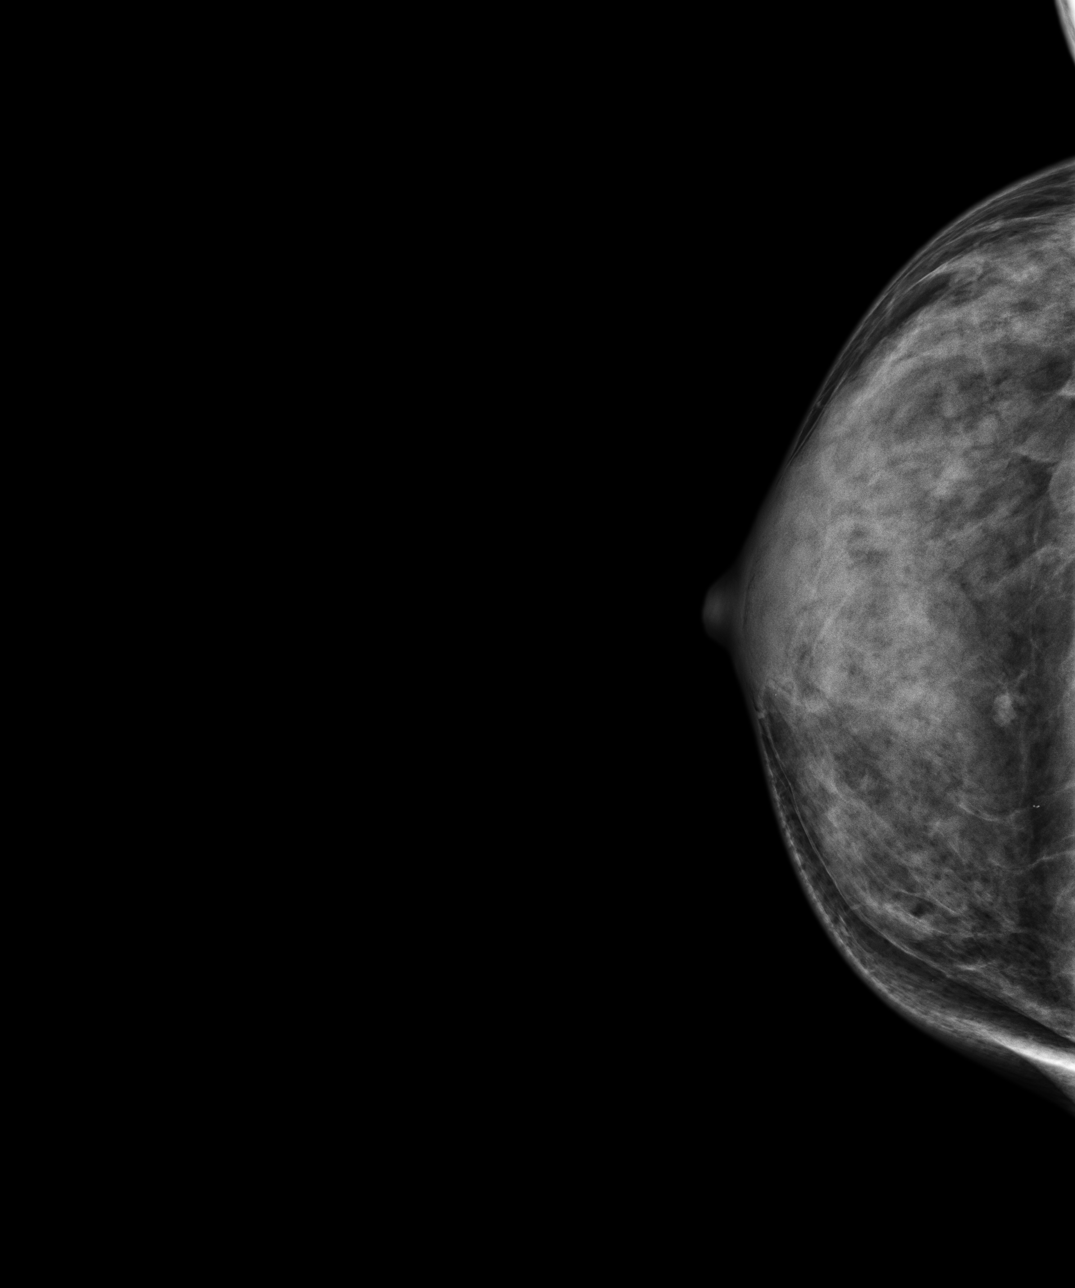
[im 2/4]
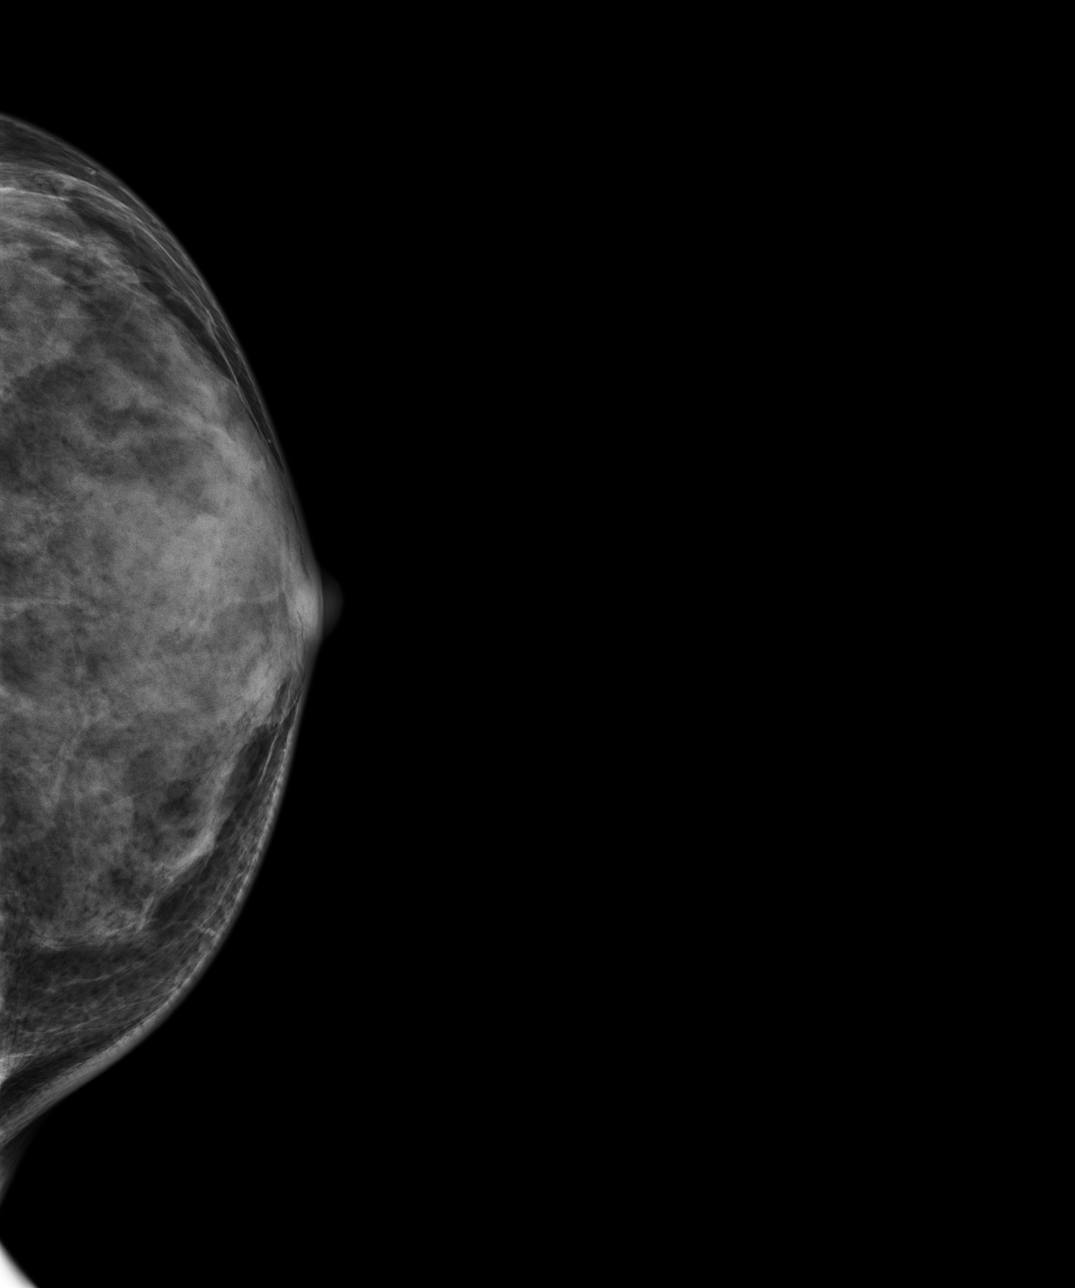
[im 3/4]
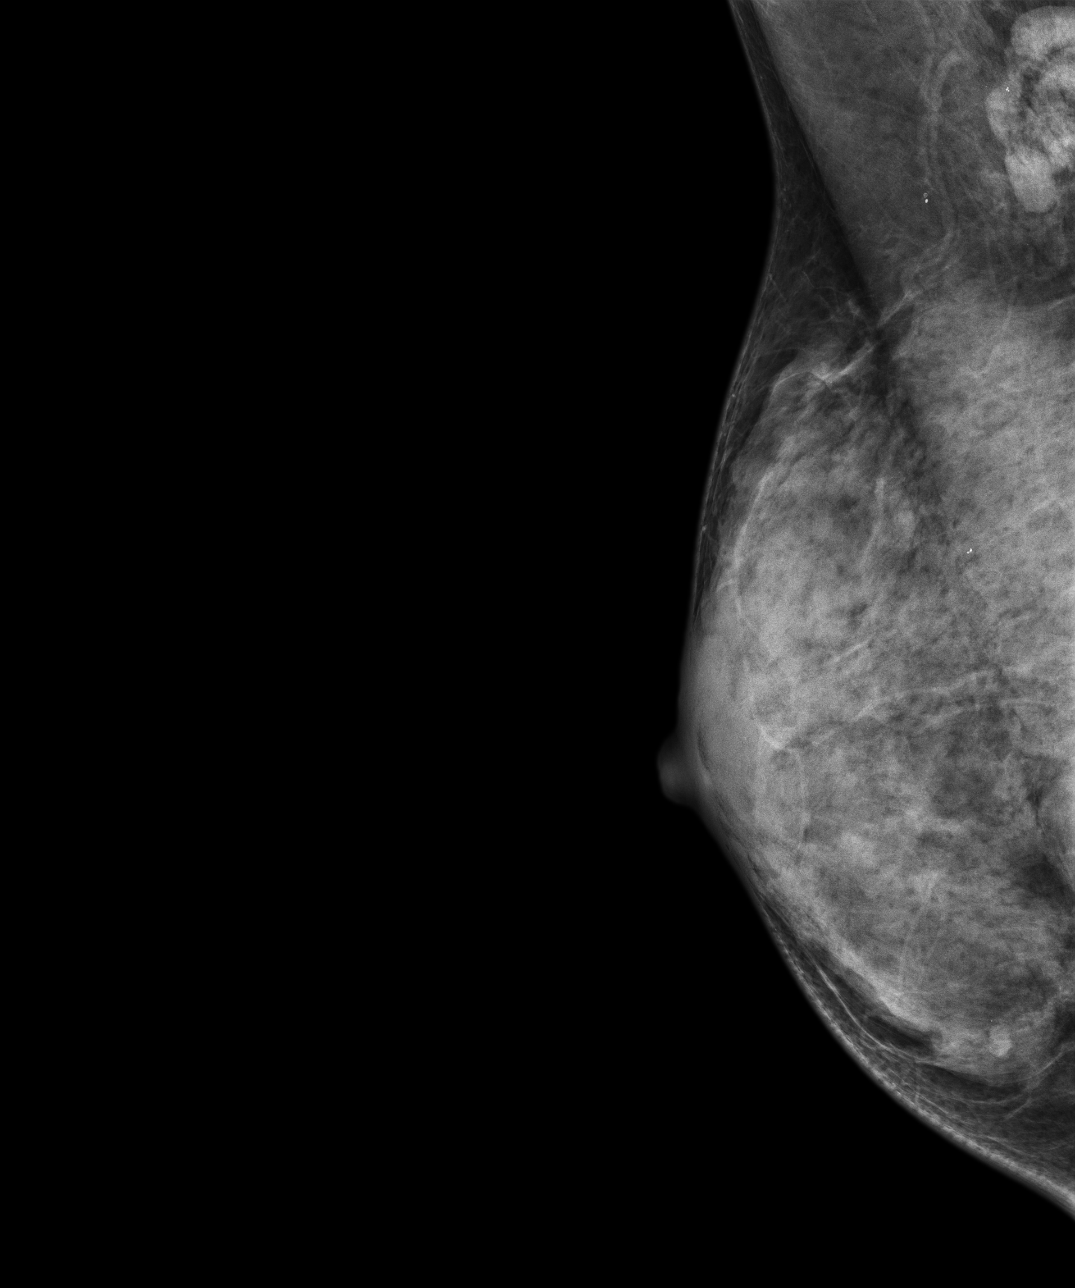
[im 4/4]
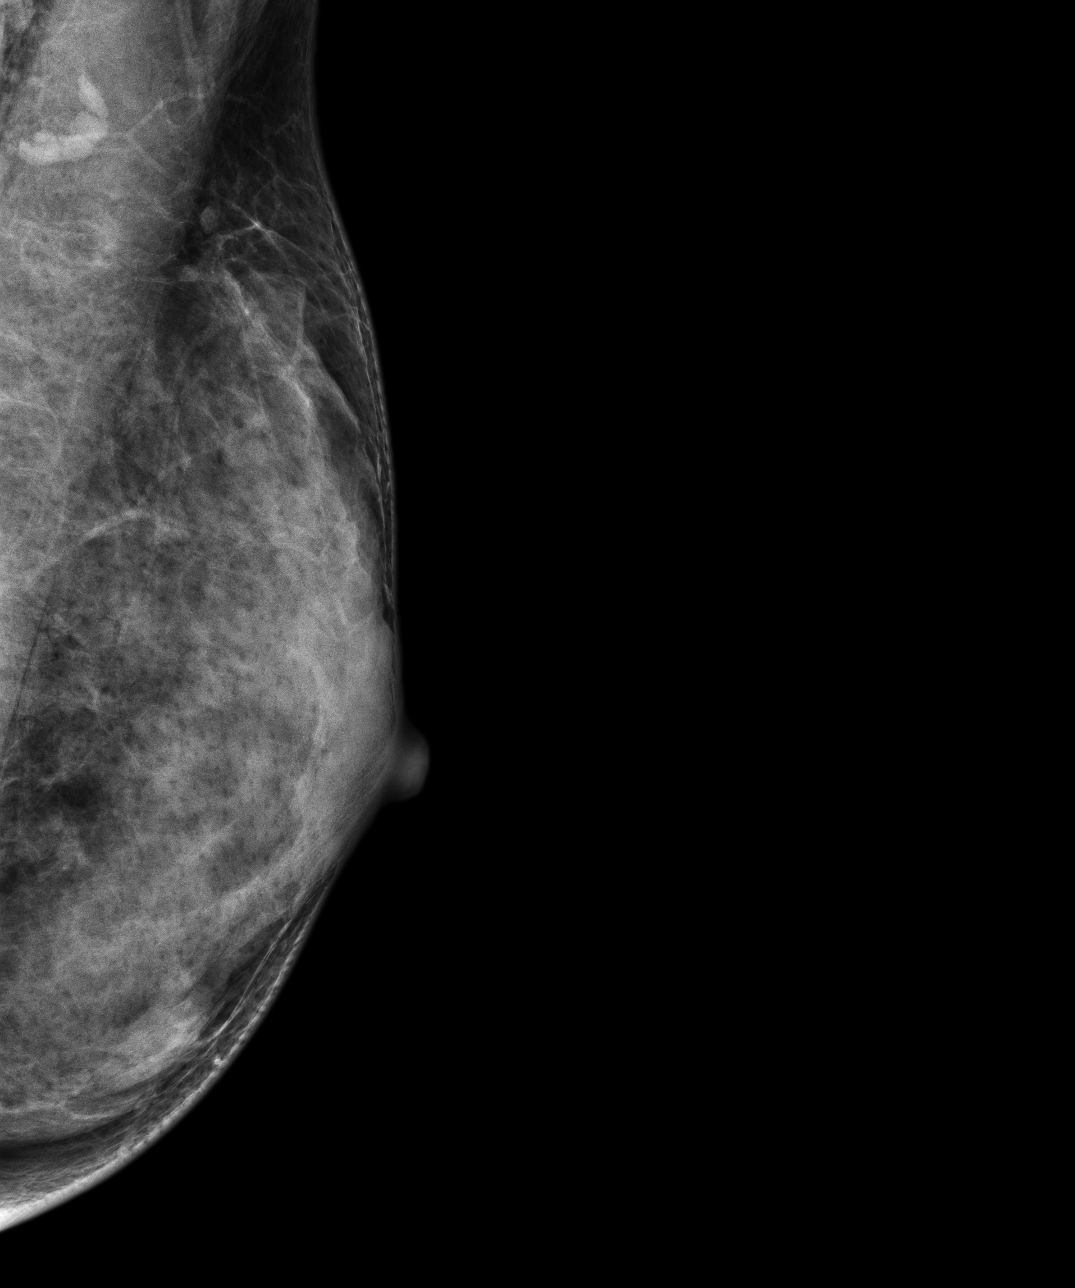

[4 of 4 positions shown; findings below may reference images not displayed]

FINDINGS: Breast parenchyma is extremely dense.  There is no mass or suspicious cluster of microcalcifications.  There is no architectural distortion, skin thickening or nipple retraction.
IMPRESSION: 1.  BIRADS 2-Benign findings. Patient has been added in a reminder system with a target date for the next screening mammography.

2.  DENSITY CODE – D (Extremely dense). 

Final Assessment Code:

Bi-Rads 2 

BI-RADS 0
 Need additional imaging evaluation.

BI-RADS 1
 Negative mammogram.

BI-RADS 2
 Benign finding.

BI-RADS 3
 Probably benign finding; short-interval follow-up suggested.

BI-RADS 4
 Suspicious abnormality; biopsy should be considered.

BI-RADS 5
 Highly suggestive of malignancy; appropriate action should be taken.

BI-RADS 6
 Known biopsy-proven malignancy; appropriate action should be taken.

NOTE:
In compliance with Federal regulations, the results of this mammogram are being sent to the patient.

------------- REPORT [id number] -------------
[facility] [place]
[address]
Tiger Ms.HAYFRON, AWINBIL:
We wish to report the following on your recent mammography examination. We are sending a report to your referring physician or other health care provider. 
(       Normal/Negative:
No evidence of cancer.
This statement is mandated by the [place], Department of Health.
Your examination was performed by one of our technologists, who are registered radiological technologists and also specially certified in mammography:
___
Markland, Marjuan (M)

Your mammogram was interpreted by our radiologist.

( 
Collette Sedman, M.D.

(Annual Breast Examination by a physician or other health care provider
(Annual Mammography Screening beginning at age 40
(Monthly Breast Self Examination

## 2022-01-14 ENCOUNTER — Other Ambulatory Visit: Payer: Self-pay

## 2022-01-14 ENCOUNTER — Other Ambulatory Visit: Payer: MEDICAID | Attending: FAMILY PRACTICE

## 2022-01-14 ENCOUNTER — Other Ambulatory Visit (HOSPITAL_COMMUNITY): Payer: Self-pay | Admitting: FAMILY PRACTICE

## 2022-01-14 DIAGNOSIS — E785 Hyperlipidemia, unspecified: Secondary | ICD-10-CM

## 2022-01-14 DIAGNOSIS — E538 Deficiency of other specified B group vitamins: Secondary | ICD-10-CM

## 2022-01-14 DIAGNOSIS — E559 Vitamin D deficiency, unspecified: Secondary | ICD-10-CM

## 2022-01-14 DIAGNOSIS — I10 Essential (primary) hypertension: Secondary | ICD-10-CM

## 2022-01-14 DIAGNOSIS — Z79899 Other long term (current) drug therapy: Secondary | ICD-10-CM

## 2022-01-14 DIAGNOSIS — R7303 Prediabetes: Secondary | ICD-10-CM

## 2022-01-15 LAB — MICROALBUMIN/CREATININE RATIO, URINE, RANDOM
CREATININE RANDOM URINE: 66 mg/dL — ABNORMAL HIGH (ref 11–26)
MICROALBUMIN RANDOM URINE: 10 mg/dL
MICROALBUMIN/CREATININE RATIO RANDOM URINE: 151.5 mg/g

## 2022-01-15 LAB — COMPREHENSIVE METABOLIC PANEL, NON-FASTING
ALBUMIN/GLOBULIN RATIO: 0.9 (ref 0.8–1.4)
ALBUMIN: 4.1 g/dL (ref 3.5–5.7)
ALKALINE PHOSPHATASE: 126 U/L — ABNORMAL HIGH (ref 34–104)
ALT (SGPT): 7 U/L (ref 7–52)
ANION GAP: 11 mmol/L (ref 10–20)
AST (SGOT): 14 U/L (ref 13–39)
BILIRUBIN TOTAL: 0.4 mg/dL (ref 0.3–1.2)
BUN/CREA RATIO: 9 (ref 6–22)
BUN: 9 mg/dL (ref 7–25)
CALCIUM, CORRECTED: 9.4 mg/dL (ref 8.9–10.8)
CALCIUM: 9.5 mg/dL (ref 8.6–10.3)
CHLORIDE: 104 mmol/L (ref 98–107)
CO2 TOTAL: 25 mmol/L (ref 21–31)
CREATININE: 0.96 mg/dL (ref 0.60–1.30)
ESTIMATED GFR: 68 mL/min/{1.73_m2} (ref >59)
GLOBULIN: 4.5 (ref 2.9–5.4)
GLUCOSE: 83 mg/dL (ref 74–109)
OSMOLALITY, CALCULATED: 277 mosm/kg (ref 270–290)
POTASSIUM: 4.3 mmol/L (ref 3.5–5.1)
PROTEIN TOTAL: 8.6 g/dL (ref 6.4–8.9)
SODIUM: 140 mmol/L (ref 136–145)

## 2022-01-15 LAB — URINALYSIS, MACROSCOPIC
BILIRUBIN: NEGATIVE mg/dL
BLOOD: NEGATIVE mg/dL
GLUCOSE: NEGATIVE mg/dL
KETONES: NEGATIVE mg/dL
LEUKOCYTES: NEGATIVE WBCs/uL
NITRITE: NEGATIVE
PH: 7 (ref 5.0–9.0)
PROTEIN: NEGATIVE mg/dL
SPECIFIC GRAVITY: 1.012 (ref 1.002–1.030)
UROBILINOGEN: NORMAL mg/dL

## 2022-01-15 LAB — CBC WITH DIFF
BASOPHIL #: 0.1 10*3/uL (ref 0.00–2.50)
BASOPHIL %: 1 % (ref 0–3)
EOSINOPHIL #: 0.1 10*3/uL (ref 0.00–2.40)
EOSINOPHIL %: 1 % (ref 0–7)
HCT: 38.2 % (ref 37.0–47.0)
HGB: 12.8 g/dL (ref 12.5–16.0)
LYMPHOCYTE #: 4.6 10*3/uL (ref 2.10–11.00)
LYMPHOCYTE %: 49 % — ABNORMAL HIGH (ref 25–45)
MCH: 31 pg (ref 27.0–32.0)
MCHC: 33.5 g/dL (ref 32.0–36.0)
MCV: 92.7 fL (ref 78.0–99.0)
MONOCYTE #: 0.1 10*3/uL (ref 0.00–4.10)
MONOCYTE %: 1 % (ref 0–12)
MPV: 7.8 fL (ref 7.4–10.4)
NEUTROPHIL #: 4.6 10*3/uL (ref 4.10–29.00)
NEUTROPHIL %: 49 % (ref 40–76)
PLATELETS: 470 10*3/uL — ABNORMAL HIGH (ref 140–440)
RBC: 4.13 10*6/uL — ABNORMAL LOW (ref 4.20–5.40)
RDW: 14.5 % (ref 11.6–14.8)
WBC: 9.5 10*3/uL (ref 4.0–10.5)
WBCS UNCORRECTED: 9.5 10*3/uL

## 2022-01-15 LAB — URINALYSIS, MICROSCOPIC
SQUAMOUS EPITHELIAL: <1 /HPF (ref <28)
WBCS: <1 /hpf (ref <6)

## 2022-01-15 LAB — LIPID PANEL
CHOL/HDL RATIO: 4.8
CHOLESTEROL: 237 mg/dL (ref 136–290)
HDL CHOL: 49 mg/dL (ref 23–92)
LDL CALC: 156 mg/dL — ABNORMAL HIGH (ref 0–100)
TRIGLYCERIDES: 161 mg/dL — ABNORMAL HIGH (ref <=150)
VLDL CALC: 32 mg/dL (ref 0–50)

## 2022-01-15 LAB — GOLD TOP TUBE

## 2022-01-15 LAB — THYROID STIMULATING HORMONE (SENSITIVE TSH): TSH: 0.797 u[IU]/mL (ref 0.450–5.330)

## 2022-01-15 LAB — VITAMIN B12: VITAMIN B 12: 594 pg/mL (ref 180–914)

## 2022-01-15 LAB — FOLATE: FOLATE: 19.9 ng/mL (ref 5.9–24.4)

## 2022-01-15 LAB — LIGHT GREEN TOP TUBE

## 2022-01-15 LAB — HGA1C (HEMOGLOBIN A1C WITH EST AVG GLUCOSE): HEMOGLOBIN A1C: 5.8 %

## 2022-02-11 ENCOUNTER — Other Ambulatory Visit (HOSPITAL_COMMUNITY): Payer: Self-pay | Admitting: Family

## 2022-02-11 DIAGNOSIS — R109 Unspecified abdominal pain: Secondary | ICD-10-CM

## 2022-02-23 IMAGING — MR MRI CERVICAL SPINE WITHOUT CONTRAST
4 series · 25 of 48 positions shown · IV contrast (gadolinium)
Comparison: None available.

﻿EXAM:  [id number]   MRI CERVICAL SPINE WITHOUT CONTRAST
INDICATION: Neck pain with numbness of the upper extremities.
TECHNIQUE: Multiplanar multisequential MRI of the cervical spine was performed without gadolinium contrast.

[Series 5: T2 · sagittal · 3.0mm · 0.75mm/px · 11 of 15 slices shown (1 of 2)]
[im 1/15]
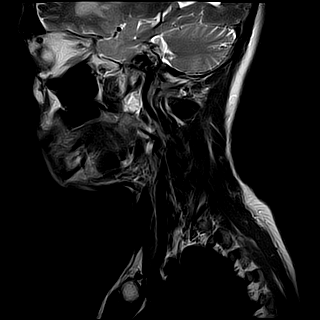
[im 2/15]
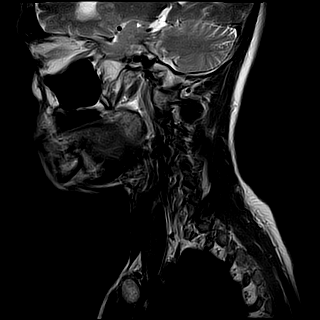
[im 3/15]
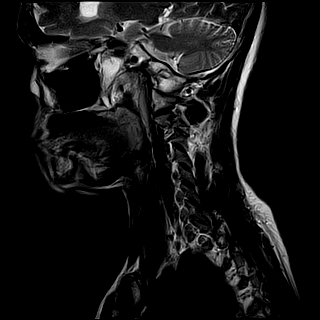
[im 5/15]
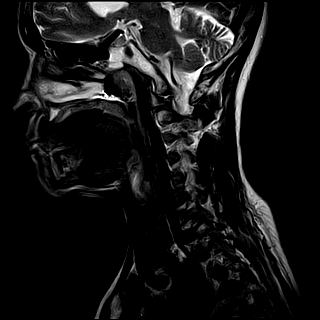
[im 6/15]
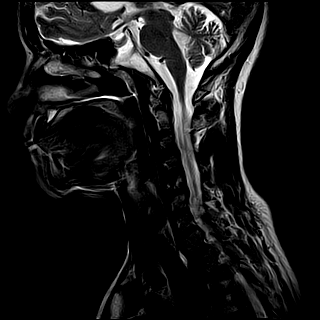
[im 8/15]
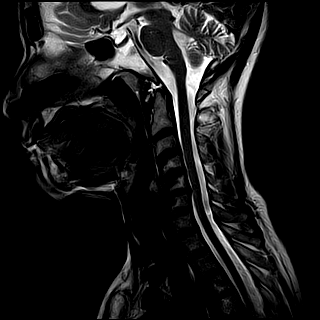
[im 9/15]
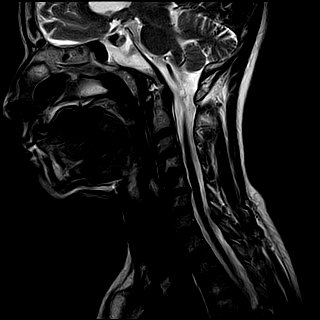
[im 10/15]
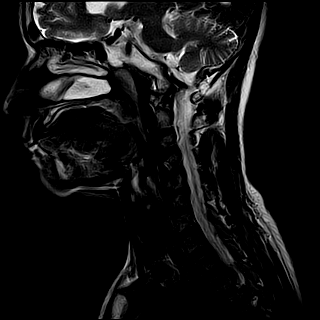
[im 12/15]
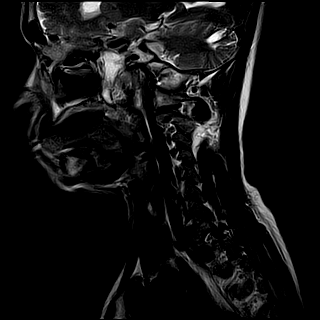
[im 13/15]
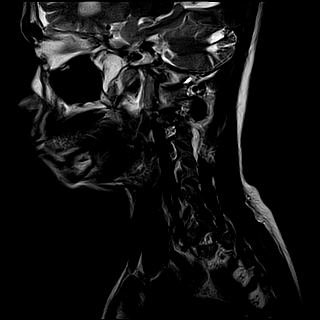
[im 15/15]
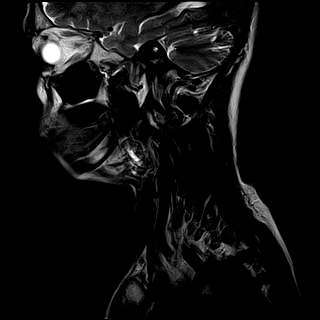

[Series 6: T1 · sagittal · 3.0mm · 0.47mm/px · 3 of 15 slices shown]
[im 2/15]
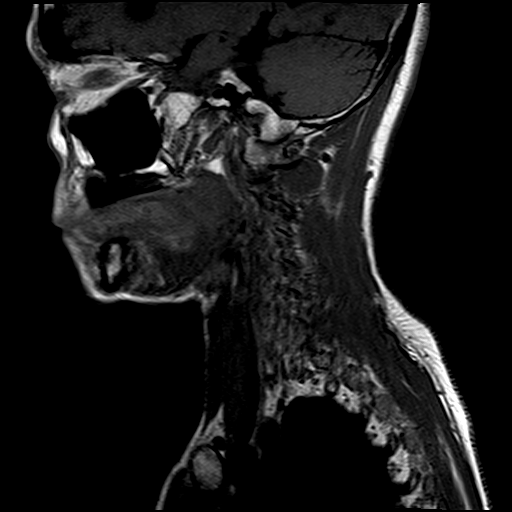
[im 8/15]
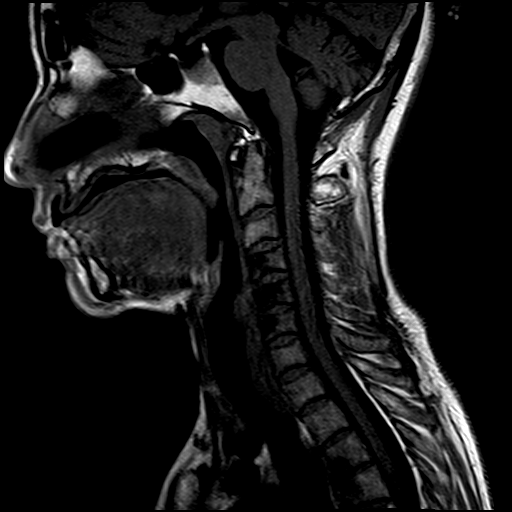
[im 13/15]
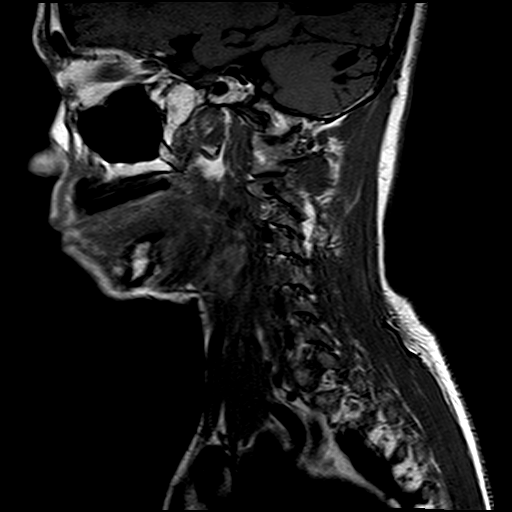

[Series 8: T2-star · axial · 3.0mm · 0.39mm/px · z∈[-87,-27]mm · 3 of 18 slices shown]
[im 3/18]
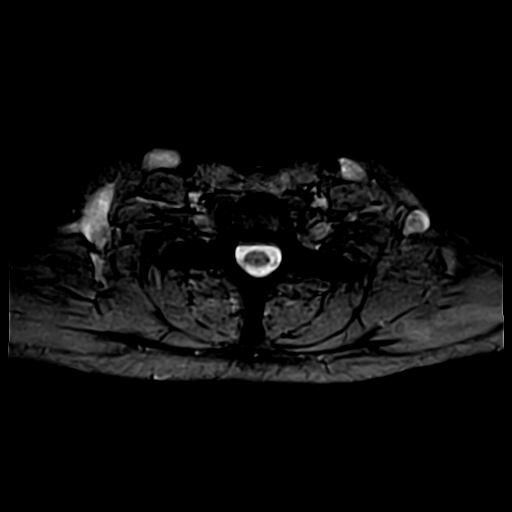
[im 9/18]
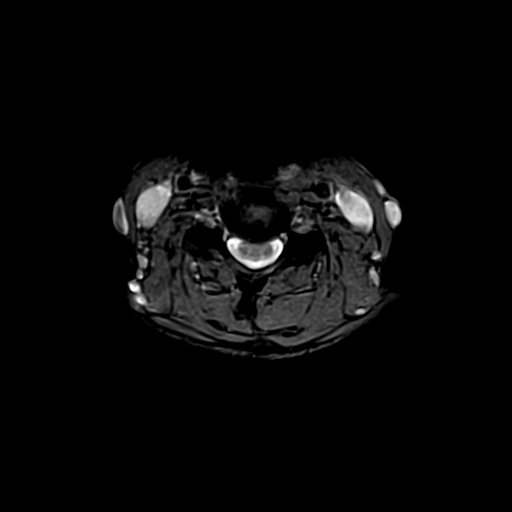
[im 15/18]
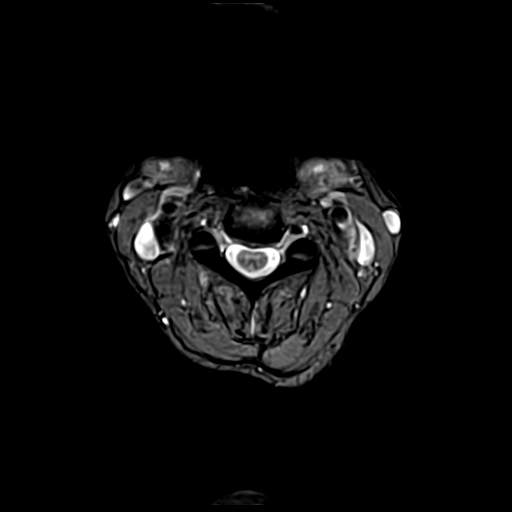

[Series 9: T2 · axial · 3.0mm · 0.39mm/px · z∈[-95,-27]mm · 8 of 18 slices shown (2 of 2)]
[im 1/18]
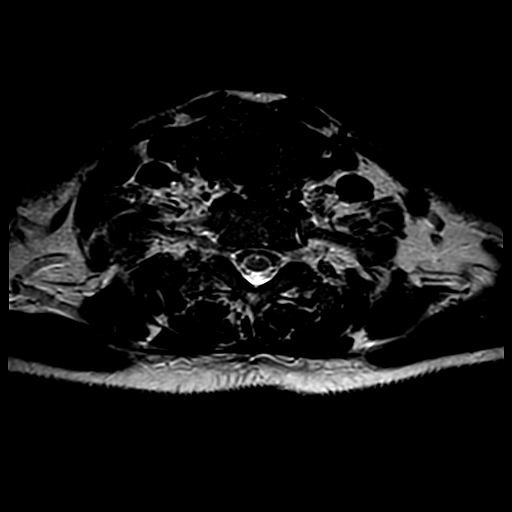
[im 3/18]
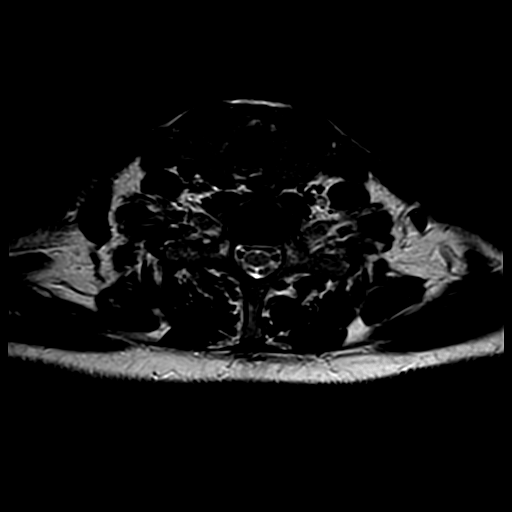
[im 6/18]
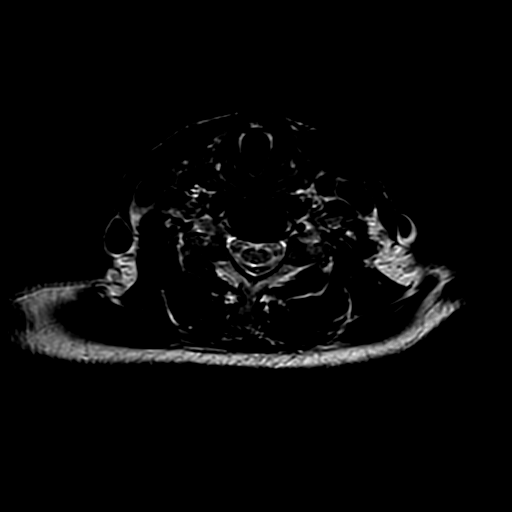
[im 8/18]
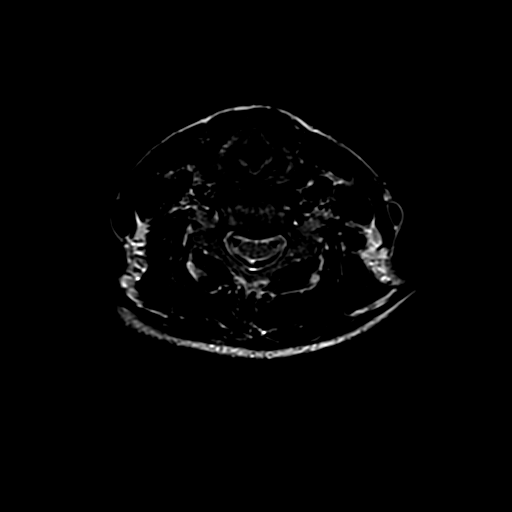
[im 9/18]
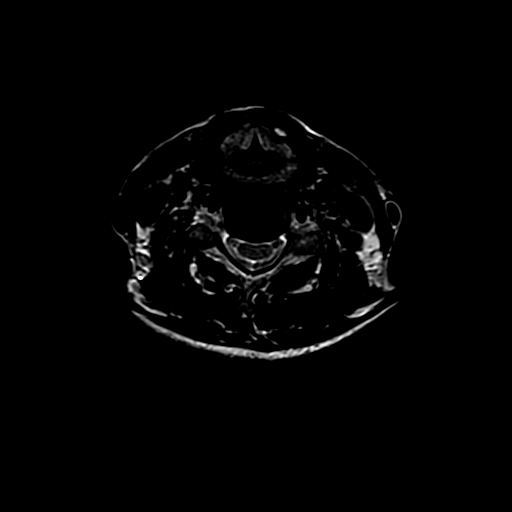
[im 10/18]
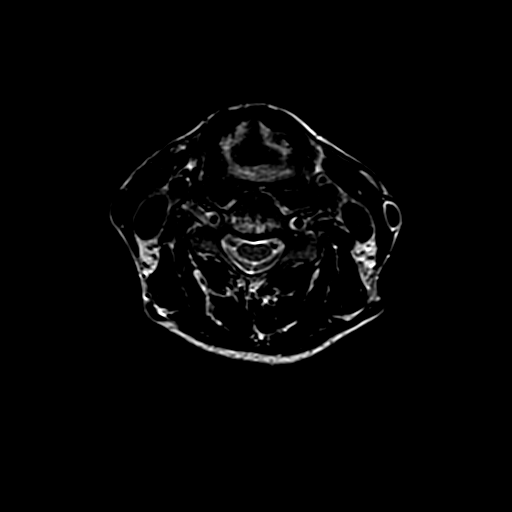
[im 12/18]
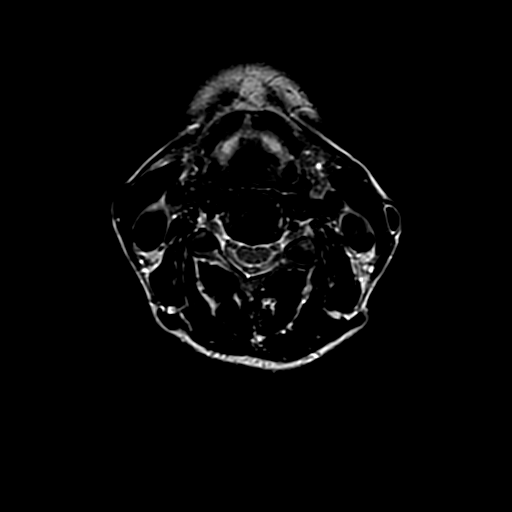
[im 15/18]
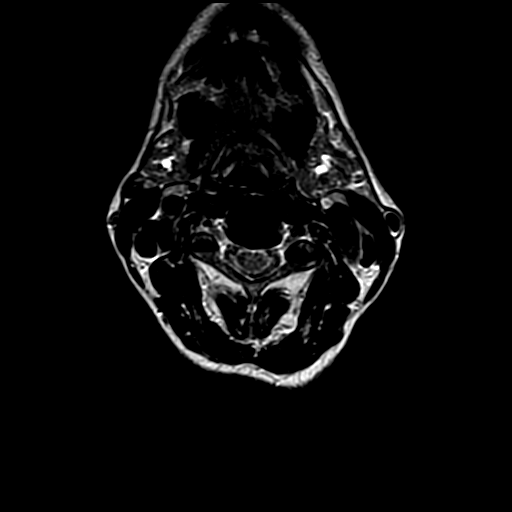

[25 of 48 positions shown; findings below may reference images not displayed]

FINDINGS: There is abnormal low heterogeneous T1 signal intensity within the visualized cervical and thoracic vertebral bodies. No acute fracture or subluxation is seen.  Visualized spinal cord is normal in signal intensity without evidence of compression at any level. 

C2-3, C3-4 and C4-5 levels are unremarkable. 

At C5-6 level, there is a small broad-based central disc osteophyte complex minimally abutting the ventral cord. There is moderate to severe left neural foraminal stenosis from uncovertebral joint hypertrophy. 

At C6-7 level, there is moderate to severe left neural foraminal stenosis from uncovertebral joint hypertrophy.

C7-T1 level and paraspinal soft tissues are unremarkable.
IMPRESSION: 1. Abnormal heterogeneous low T1 signal intensity within the visualized cervical and thoracic vertebral bodies.  Similar findings can be seen in patients with chronic anemia resulting in reconversion of fatty to red marrow. Correlation with patient's hematocrit level is recommended.  

2. Small disc osteophyte complex at C5-6 level minimally abutting the ventral cord.  

3. Moderate to severe left neural foraminal stenosis at C5-6 and C6-7 levels from uncovertebral joint hypertrophy.

## 2022-03-02 IMAGING — US US ABDOMEN COMPLETE
1 series · 14 of 25 positions shown · non-contrast
Comparison: None.

﻿EXAM:  [id number]   US ABDOMEN COMPLETE
INDICATION: Abdominal pain.  Constipation. Prior cholecystectomy, left nephrectomy, and splenectomy.

[Series 1: us abdomen complete · 14 of 41 slices shown]
[im 1/41]
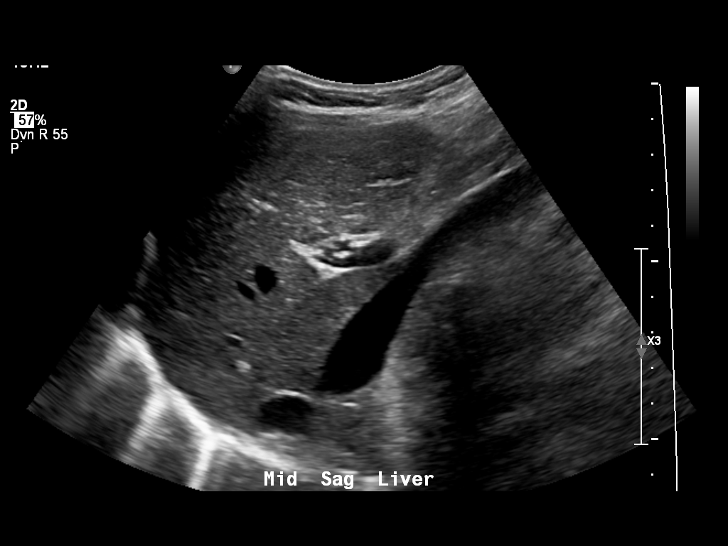
[im 4/41]
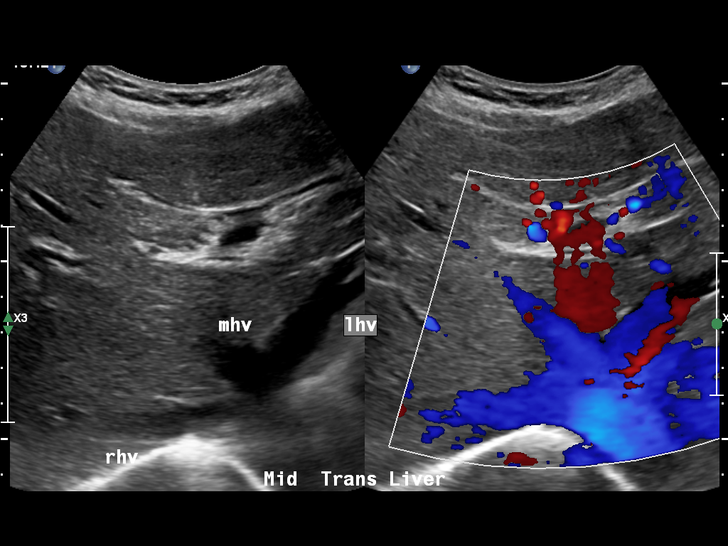
[im 7/41]
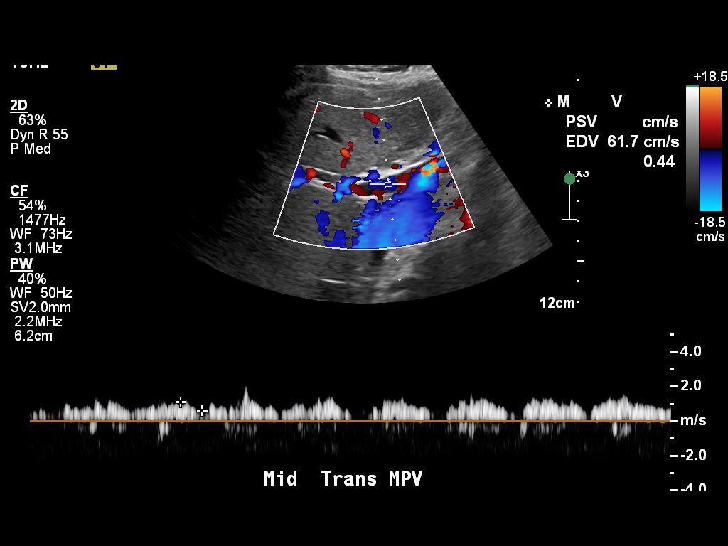
[im 11/41]
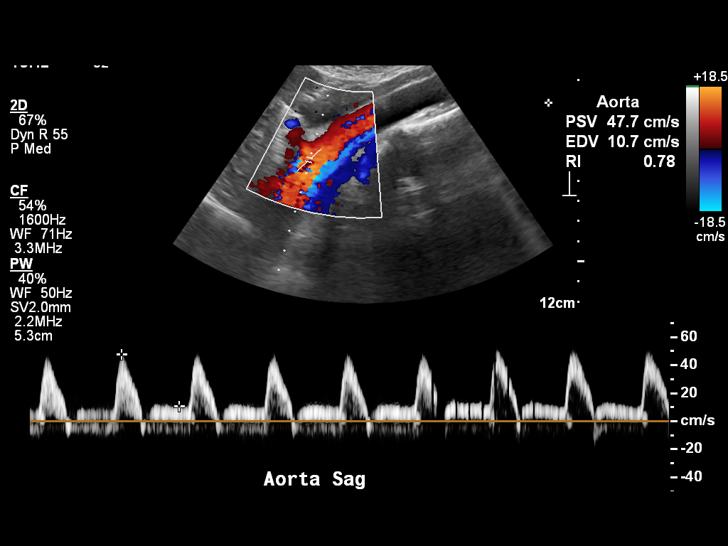
[im 14/41]
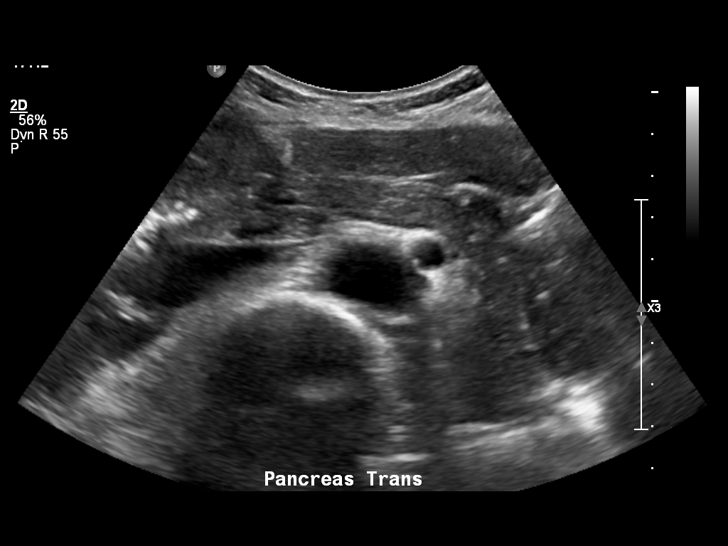
[im 16/41]
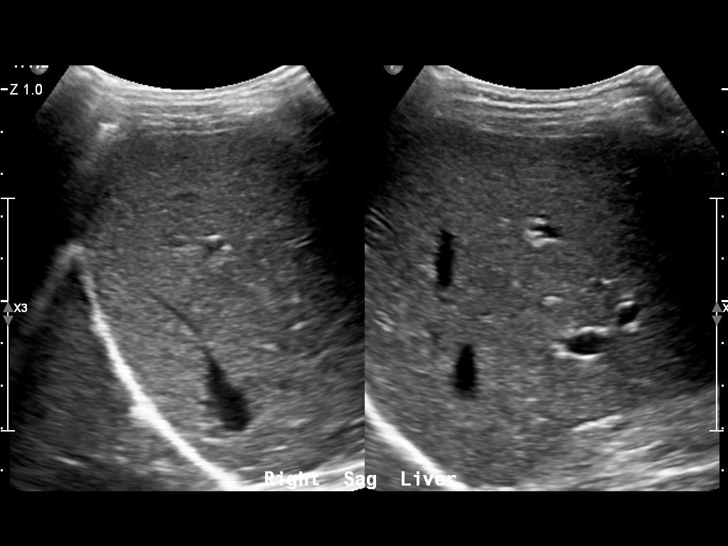
[im 19/41]
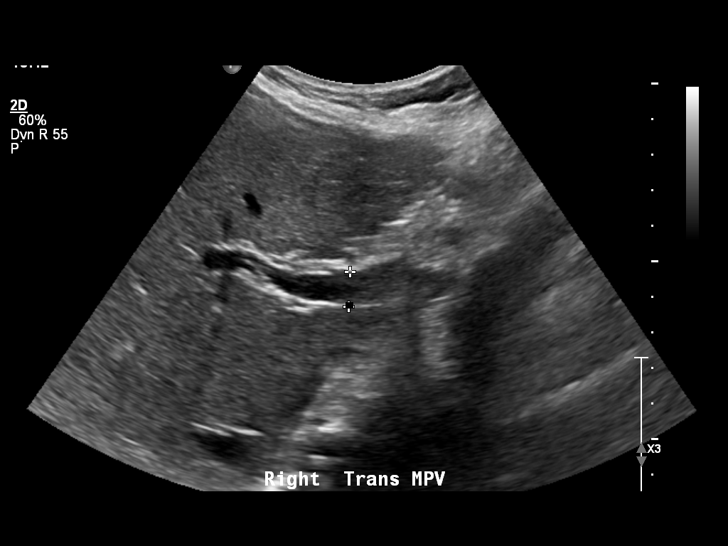
[im 22/41]
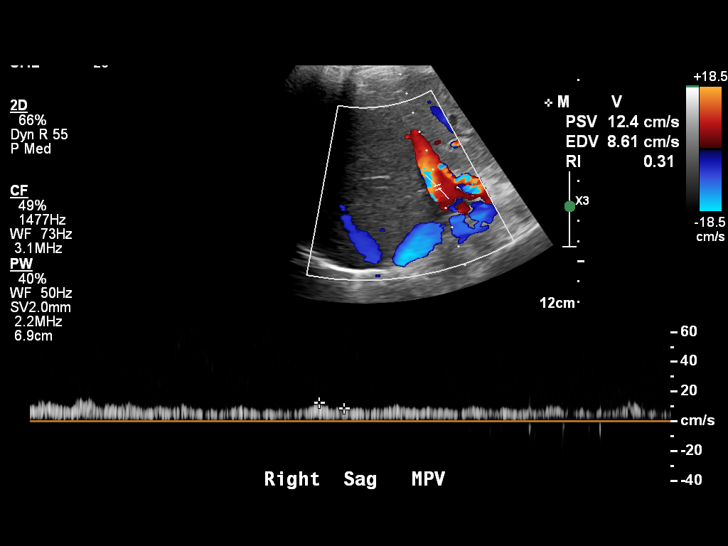
[im 26/41]
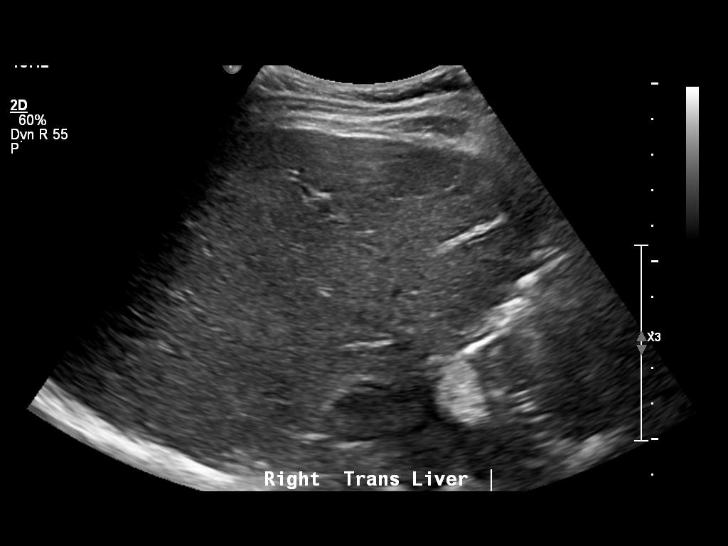
[im 27/41]
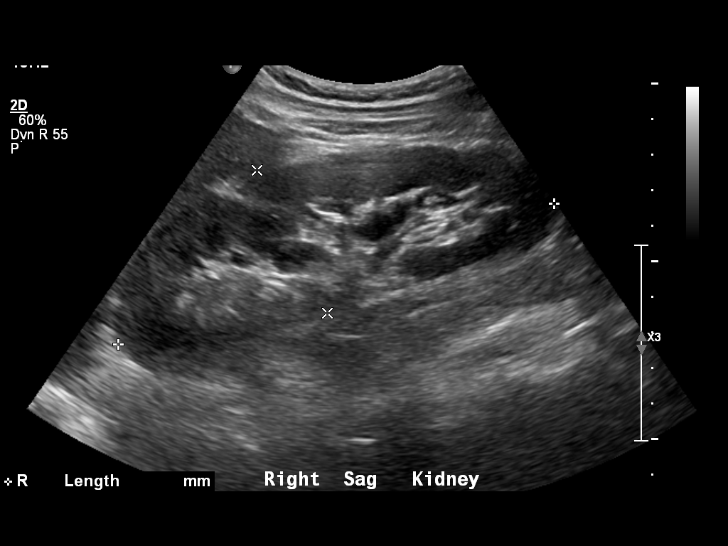
[im 31/41]
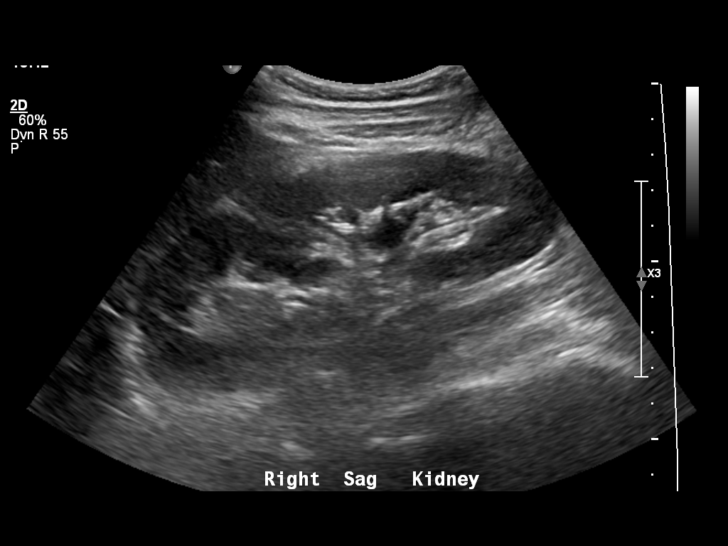
[im 34/41]
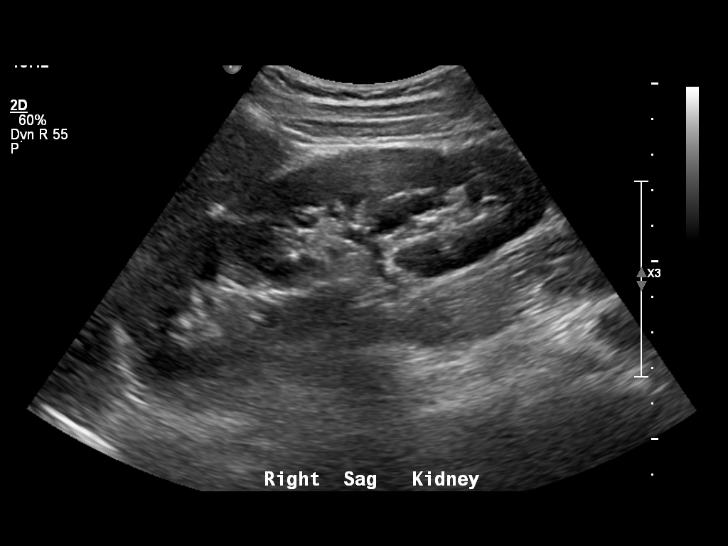
[im 37/41]
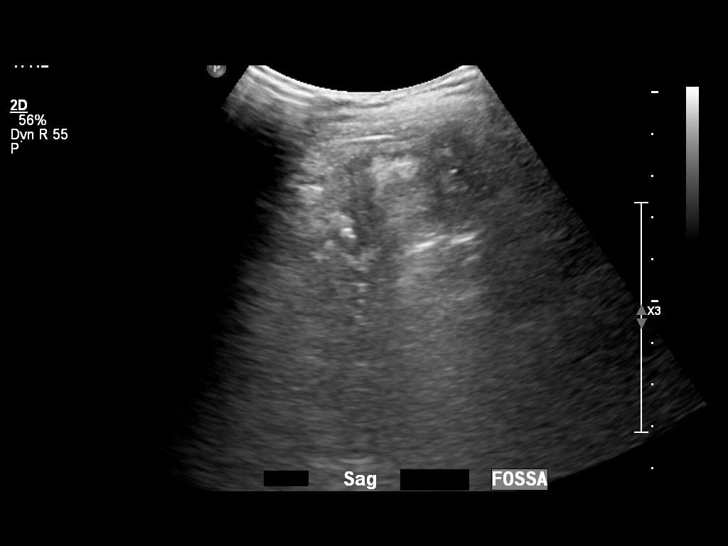
[im 41/41]
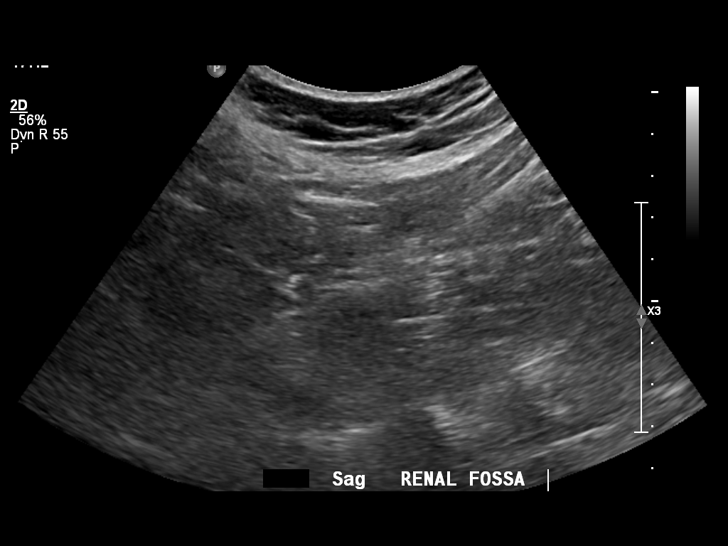

[14 of 25 positions shown; findings below may reference images not displayed]

FINDINGS: The liver appears unremarkable with no evidence of mass or biliary dilatation.  The common duct measures 4 mm in diameter.  

The gallbladder, left kidney, and spleen are absent.  

The visualized pancreas, aorta, and inferior vena cava appear unremarkable.  The right kidney appears within normal limits.
IMPRESSION: 1. Post splenectomy, cholecystectomy, and left nephrectomy. 

2. Otherwise unremarkable examination.

## 2022-03-18 ENCOUNTER — Other Ambulatory Visit: Payer: MEDICAID | Attending: Internal Medicine | Admitting: Internal Medicine

## 2022-03-18 DIAGNOSIS — R109 Unspecified abdominal pain: Secondary | ICD-10-CM | POA: Insufficient documentation

## 2022-03-24 DIAGNOSIS — R109 Unspecified abdominal pain: Secondary | ICD-10-CM

## 2022-03-24 LAB — SURGICAL PATHOLOGY SPECIMEN

## 2022-03-30 ENCOUNTER — Other Ambulatory Visit: Payer: Self-pay

## 2022-03-30 ENCOUNTER — Other Ambulatory Visit: Payer: MEDICAID | Attending: FAMILY PRACTICE

## 2022-03-30 DIAGNOSIS — D509 Iron deficiency anemia, unspecified: Secondary | ICD-10-CM | POA: Insufficient documentation

## 2022-03-31 LAB — IRON: IRON: 45 ug/dL — ABNORMAL LOW (ref 50–212)

## 2022-05-20 IMAGING — CT LUNG CANCER SCREENING
2 of 4 series · 15 of 36 positions shown, 18 images · non-contrast
Comparison: Chest radiograph dated 09/11/2019. No prior chest CT scan is available for comparison.

﻿EXAM:  LUNG CANCER SCREENING
INDICATION: Nicotine dependence.
TECHNIQUE: Low-dose helical noncontrast CT imaging of the chest was performed. Images were reviewed in multiple windows and projections. Exam was performed using 1 or more of the following dose reduction techniques: Automated exposure control, adjustment of the mA and/or kV according to patient size, or the use of iterative reconstruction technique.

[Series 8034: lung · axial · 0.63mm/px · z∈[-400,-132]mm · 12 of 154 slices shown, 15 images]
[im 10/154  mediastinal]
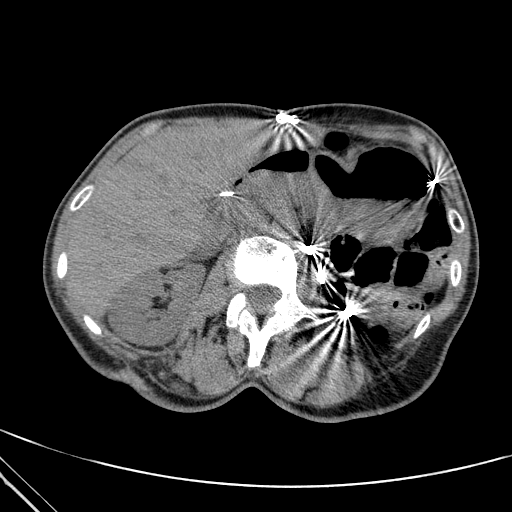
[im 10/154  lung]
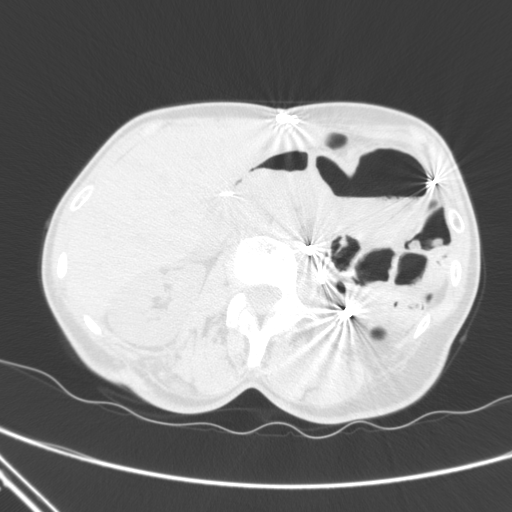
[im 20/154  lung]
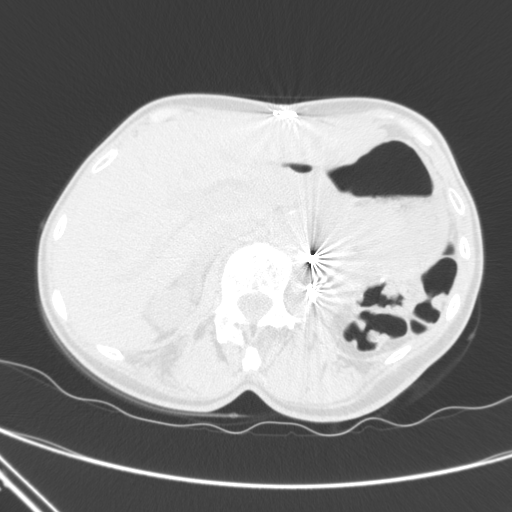
[im 39/154  lung]
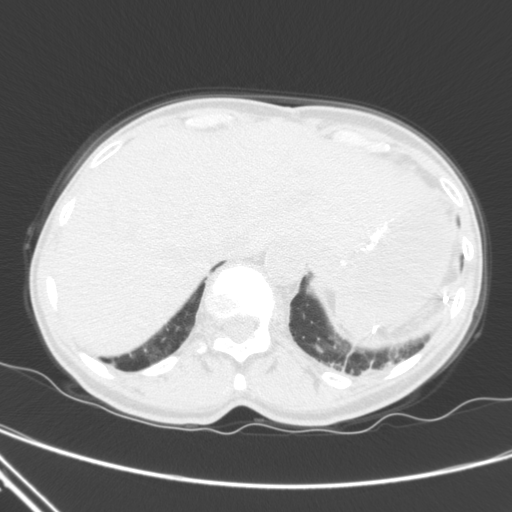
[im 48/154  lung]
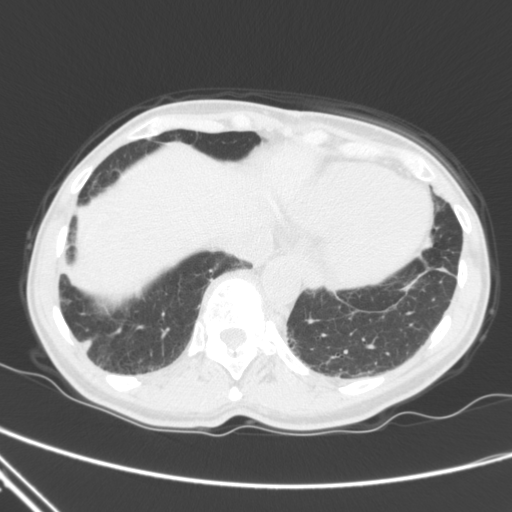
[im 58/154  mediastinal]
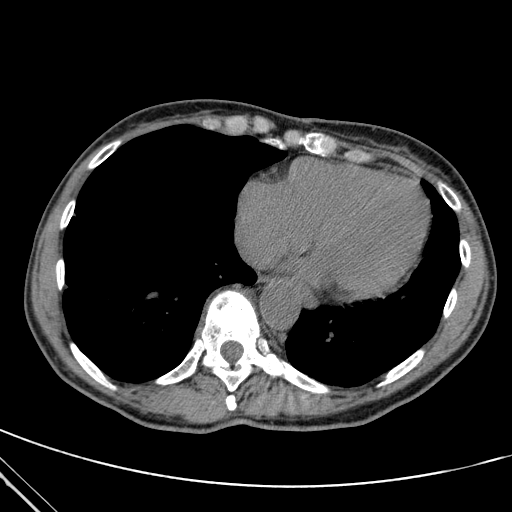
[im 58/154  lung]
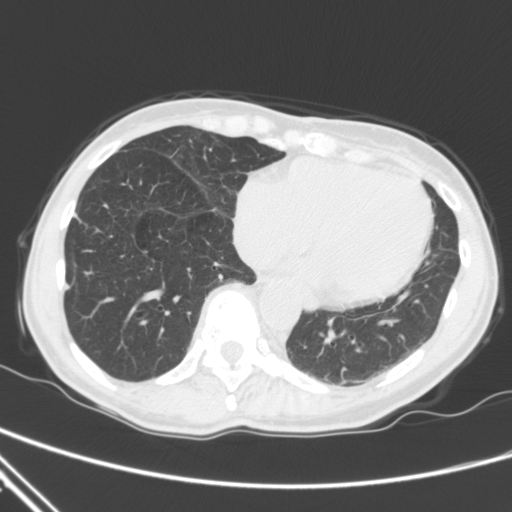
[im 67/154  lung]
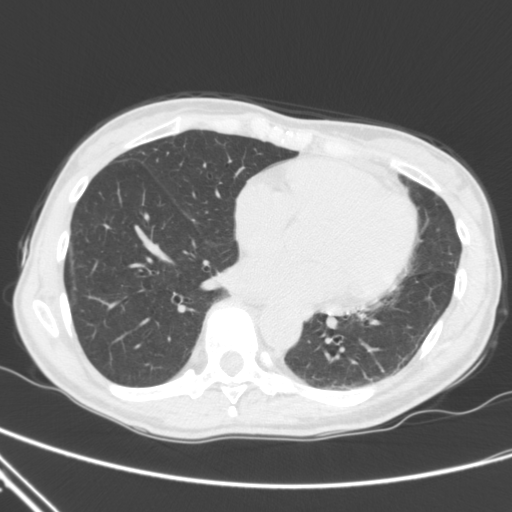
[im 87/154  lung]
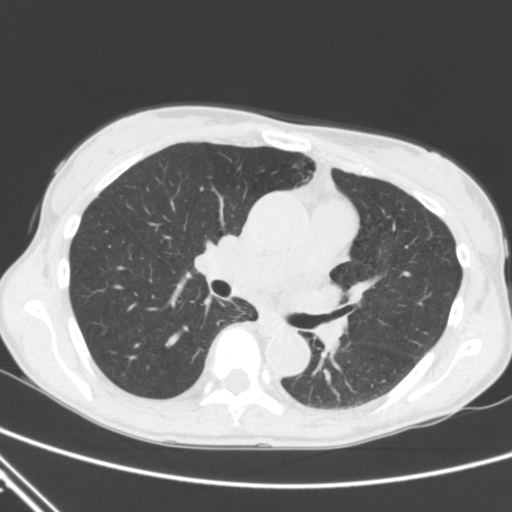
[im 96/154  lung]
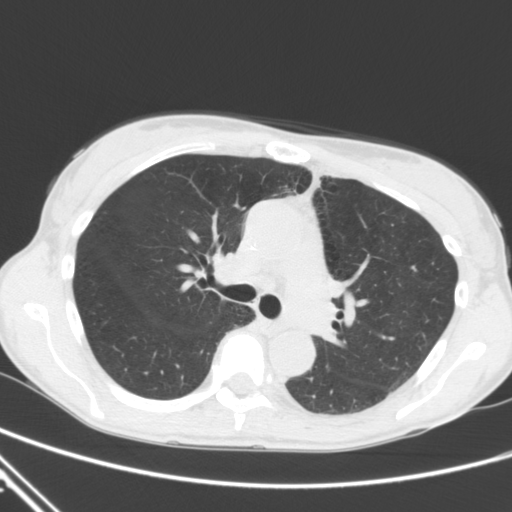
[im 106/154  mediastinal]
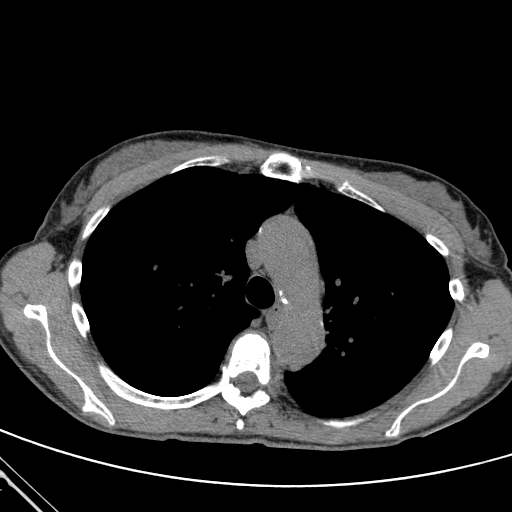
[im 106/154  lung]
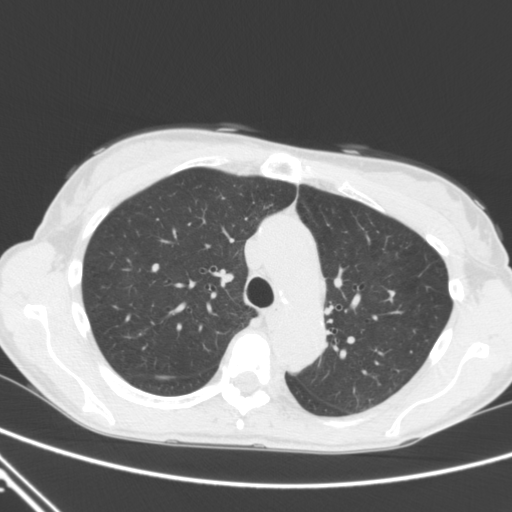
[im 115/154  lung]
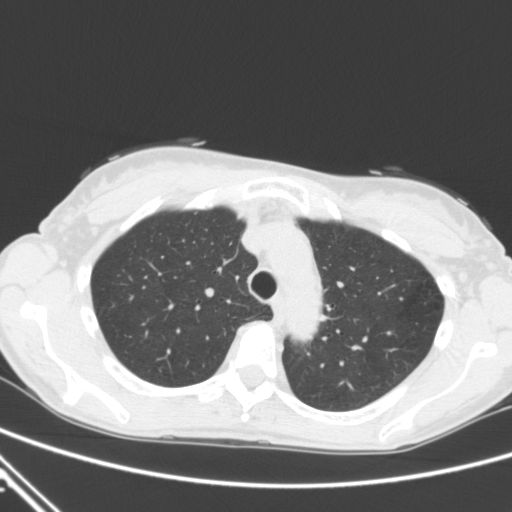
[im 134/154  lung]
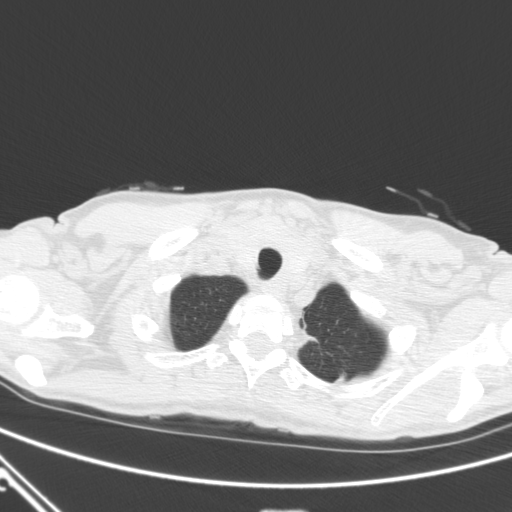
[im 144/154  lung]
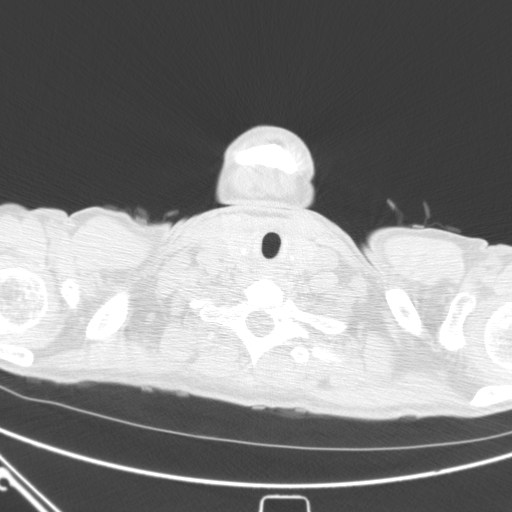

[cor · coronal · 0.63mm/px · 3 of 57 slices shown]
[im 12/57  lung]
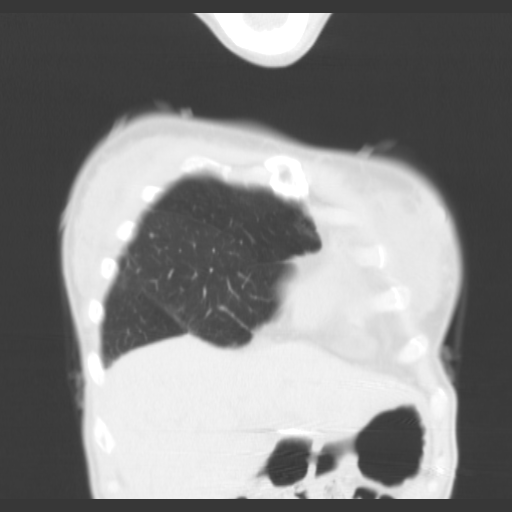
[im 23/57  lung]
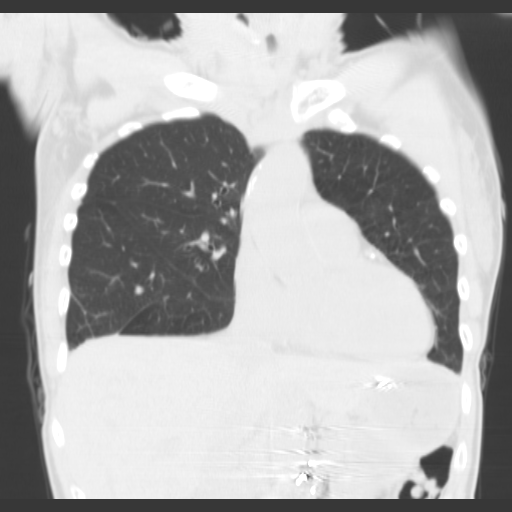
[im 34/57  lung]
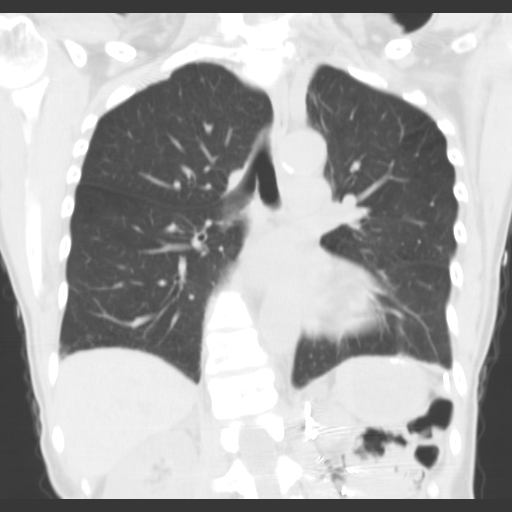

[15 of 36 positions shown; findings below may reference images not displayed]

FINDINGS: Thyroid gland appears unremarkable. Trachea and mainstem bronchi are patent.  There is no mediastinal or axillary adenopathy. Evaluation for hilar adenopathy is limited due to the lack of intravenous contrast.  There are no significant emphysematous changes within the lungs.  There is no suspicious lung nodule. There is mild bibasilar scarring. There is no pleural or pericardial effusion. There is no lung consolidation.  There is no aortic aneurysm.  There are scattered coronary artery calcifications.  Significant postsurgical changes of the upper abdomen are noted.  Increased sclerosis is seen within most of the thoracic and lumbar vertebral bodies.  There is also mild dextroscoliosis centered within the midthoracic spine.
IMPRESSION: 1. Lung rads 2-benign findings. Repeat low-dose chest CT scan is recommended in 12 months for reassessment.  

2. Increased sclerosis within most of the visualized thoracic and lumbar vertebral bodies.  Findings suspicious for osseous metastatic disease such as from prostate cancer.  Follow-up contrast-enhanced MRI or bone scan is recommended for better assessment.

## 2022-09-25 ENCOUNTER — Other Ambulatory Visit (HOSPITAL_COMMUNITY): Payer: Self-pay | Admitting: PULMONARY DISEASE

## 2022-09-25 DIAGNOSIS — M5451 Vertebrogenic low back pain: Secondary | ICD-10-CM

## 2022-11-18 ENCOUNTER — Ambulatory Visit (HOSPITAL_COMMUNITY): Payer: Self-pay

## 2022-11-19 ENCOUNTER — Other Ambulatory Visit: Payer: Self-pay

## 2022-11-19 ENCOUNTER — Other Ambulatory Visit (HOSPITAL_COMMUNITY): Payer: MEDICAID

## 2022-11-19 ENCOUNTER — Inpatient Hospital Stay
Admission: RE | Admit: 2022-11-19 | Discharge: 2022-11-19 | Disposition: A | Discharge status: Home / Self Care | Payer: MEDICAID | Source: Ambulatory Visit | Attending: PULMONARY DISEASE | Admitting: PULMONARY DISEASE

## 2022-11-19 ENCOUNTER — Inpatient Hospital Stay (HOSPITAL_COMMUNITY)
Admission: RE | Admit: 2022-11-19 | Discharge: 2022-11-19 | Disposition: A | Discharge status: Home / Self Care | Payer: MEDICAID | Source: Ambulatory Visit

## 2022-11-19 DIAGNOSIS — M5451 Vertebrogenic low back pain: Secondary | ICD-10-CM | POA: Insufficient documentation

## 2022-11-19 DIAGNOSIS — Z79899 Other long term (current) drug therapy: Secondary | ICD-10-CM

## 2022-11-19 DIAGNOSIS — E559 Vitamin D deficiency, unspecified: Secondary | ICD-10-CM | POA: Insufficient documentation

## 2022-11-19 DIAGNOSIS — R946 Abnormal results of thyroid function studies: Secondary | ICD-10-CM

## 2022-11-19 DIAGNOSIS — N182 Chronic kidney disease, stage 2 (mild): Secondary | ICD-10-CM

## 2022-11-19 DIAGNOSIS — E785 Hyperlipidemia, unspecified: Secondary | ICD-10-CM

## 2022-11-19 LAB — CBC WITH DIFF
BASOPHIL #: 0.1 10*3/uL (ref 0.00–0.10)
BASOPHIL %: 1 % (ref 0–1)
EOSINOPHIL #: 0.1 10*3/uL (ref 0.00–0.50)
EOSINOPHIL %: 1 %
HCT: 36.3 % (ref 31.2–41.9)
HGB: 12.1 g/dL (ref 10.9–14.3)
LYMPHOCYTE #: 2.8 10*3/uL (ref 1.00–3.00)
LYMPHOCYTE %: 26 % (ref 16–44)
MCH: 31.1 pg (ref 24.7–32.8)
MCHC: 33.4 g/dL (ref 32.3–35.6)
MCV: 93.1 fL (ref 75.5–95.3)
MONOCYTE #: 0.7 10*3/uL (ref 0.30–1.00)
MONOCYTE %: 7 % (ref 5–13)
MPV: 6.7 fL — ABNORMAL LOW (ref 7.9–10.8)
NEUTROPHIL #: 7 10*3/uL (ref 1.85–7.80)
NEUTROPHIL %: 65 % (ref 43–77)
PLATELETS: 357 10*3/uL (ref 140–440)
RBC: 3.89 10*6/uL (ref 3.63–4.92)
RDW: 15 % (ref 12.3–17.7)
WBC: 10.8 10*3/uL (ref 3.8–11.8)

## 2022-11-19 LAB — COMPREHENSIVE METABOLIC PANEL, NON-FASTING
ALBUMIN/GLOBULIN RATIO: 1 (ref 0.8–1.4)
ALBUMIN: 3.9 g/dL (ref 3.5–5.7)
ALKALINE PHOSPHATASE: 112 U/L — ABNORMAL HIGH (ref 34–104)
ALT (SGPT): 8 U/L (ref 7–52)
ANION GAP: 6 mmol/L (ref 4–13)
AST (SGOT): 14 U/L (ref 13–39)
BILIRUBIN TOTAL: 0.5 mg/dL (ref 0.3–1.2)
BUN/CREA RATIO: 13 (ref 6–22)
BUN: 12 mg/dL (ref 7–25)
CALCIUM, CORRECTED: 9.5 mg/dL (ref 8.9–10.8)
CALCIUM: 9.4 mg/dL (ref 8.6–10.3)
CHLORIDE: 106 mmol/L (ref 98–107)
CO2 TOTAL: 29 mmol/L (ref 21–31)
CREATININE: 0.92 mg/dL (ref 0.60–1.30)
ESTIMATED GFR: 71 mL/min/{1.73_m2} (ref >59)
GLOBULIN: 4.1 (ref 2.9–5.4)
GLUCOSE: 88 mg/dL (ref 74–109)
OSMOLALITY, CALCULATED: 281 mOsm/kg (ref 270–290)
POTASSIUM: 4.6 mmol/L (ref 3.5–5.1)
PROTEIN TOTAL: 8 g/dL (ref 6.4–8.9)
SODIUM: 141 mmol/L (ref 136–145)

## 2022-11-19 LAB — URINALYSIS, MACROSCOPIC
BILIRUBIN: NEGATIVE mg/dL
BLOOD: NEGATIVE mg/dL
GLUCOSE: NEGATIVE mg/dL
KETONES: NEGATIVE mg/dL
LEUKOCYTES: NEGATIVE WBCs/uL
NITRITE: NEGATIVE
PH: 6.5 (ref 5.0–9.0)
PROTEIN: NEGATIVE mg/dL
SPECIFIC GRAVITY: 1.008 (ref 1.002–1.030)
UROBILINOGEN: NORMAL mg/dL

## 2022-11-19 LAB — THYROID STIMULATING HORMONE (SENSITIVE TSH): TSH: 0.296 u[IU]/mL — ABNORMAL LOW (ref 0.450–5.330)

## 2022-11-19 LAB — URINALYSIS, MICROSCOPIC: SQUAMOUS EPITHELIAL: 2 /hpf (ref <28)

## 2022-11-19 LAB — LIPID PANEL
CHOL/HDL RATIO: 3.7
CHOLESTEROL: 154 mg/dL (ref <200)
HDL CHOL: 42 mg/dL (ref 23–92)
LDL CALC: 89 mg/dL (ref 0–100)
TRIGLYCERIDES: 117 mg/dL (ref <=150)
VLDL CALC: 23 mg/dL (ref 0–50)

## 2022-11-19 LAB — THYROXINE, FREE (FREE T4): THYROXINE (T4), FREE: 0.88 ng/dL (ref 0.58–1.64)

## 2022-11-19 LAB — URIC ACID: URIC ACID: 4.2 mg/dL (ref 2.3–7.6)

## 2022-11-19 LAB — PHOSPHORUS: PHOSPHORUS: 3.4 mg/dL — ABNORMAL LOW (ref 3.7–7.2)

## 2022-11-19 LAB — THYROXINE, TOTAL T4: T4 TOTAL: 10.1 ug/dL (ref 6.1–12.2)

## 2022-11-19 LAB — VITAMIN D 25 TOTAL: VITAMIN D: 76 ng/mL (ref 30–100)

## 2022-12-28 ENCOUNTER — Telehealth (INDEPENDENT_AMBULATORY_CARE_PROVIDER_SITE_OTHER): Payer: Self-pay | Admitting: INTERVENTIONAL CARDIOLOGY

## 2022-12-28 NOTE — Telephone Encounter (Signed)
Called Joann @ Dr[facility] Comment office and gave her the date for this patient.

## 2023-01-20 ENCOUNTER — Ambulatory Visit (HOSPITAL_COMMUNITY)
Admission: RE | Admit: 2023-01-20 | Discharge: 2023-01-20 | Disposition: A | Discharge status: Home / Self Care | Payer: Self-pay | Source: Ambulatory Visit

## 2023-01-20 IMAGING — MR MRI THORACIC SPINE WITHOUT CONTRAST
4 of 5 series · 26 of 48 positions shown · IV contrast (gadolinium)
Comparison: CT chest including thoracic spine dated 05/20/2022.

﻿EXAM:  [id number]   MRI THORACIC SPINE WITHOUT CONTRAST
INDICATION: Persistent mid back pain between shoulder blades, attention T3, T4, T5 areas.  No prior history of malignancy.  This patient had an abnormal finding of osteoblastic changes of thoracic and lumbar vertebrae on prior CT examination of 05/20/2022.
TECHNIQUE: Multiplanar, multisequential MRI of the thoracic spine was performed without gadolinium contrast.

[Series 8: T2 · sagittal · 4.0mm · 0.78mm/px · 8 of 13 slices shown (1 of 2)]
[im 1/13]
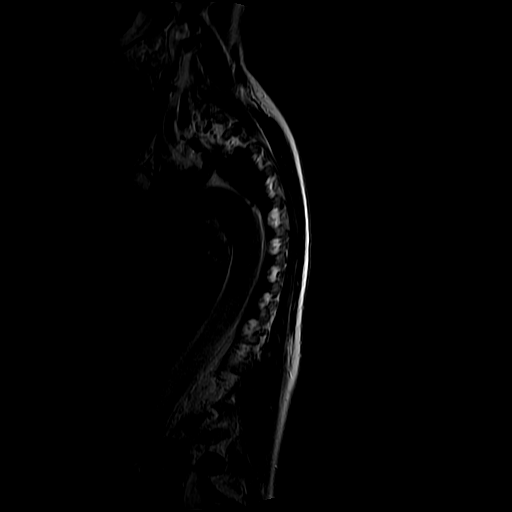
[im 2/13]
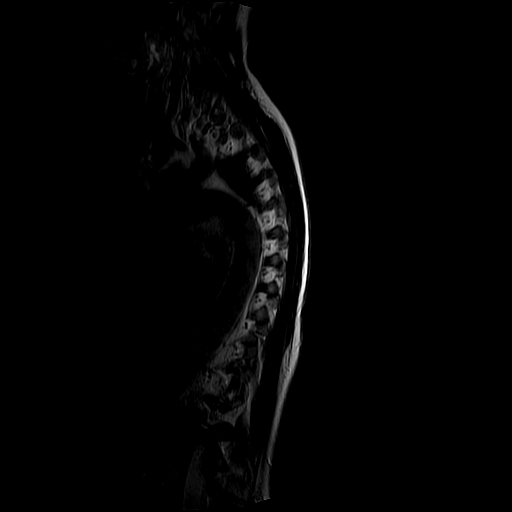
[im 5/13]
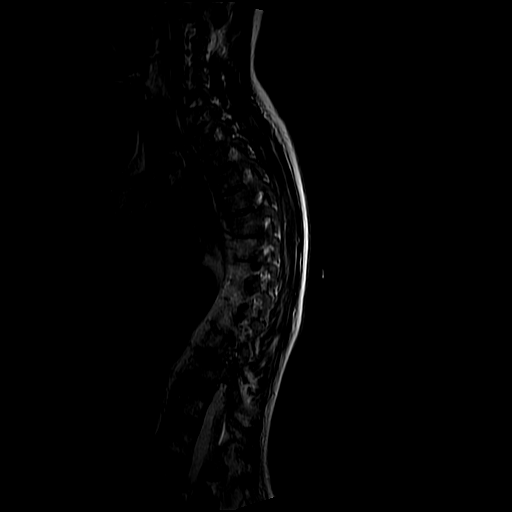
[im 6/13]
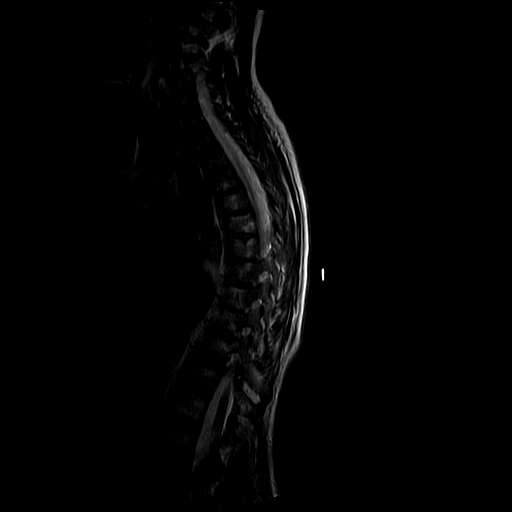
[im 7/13]
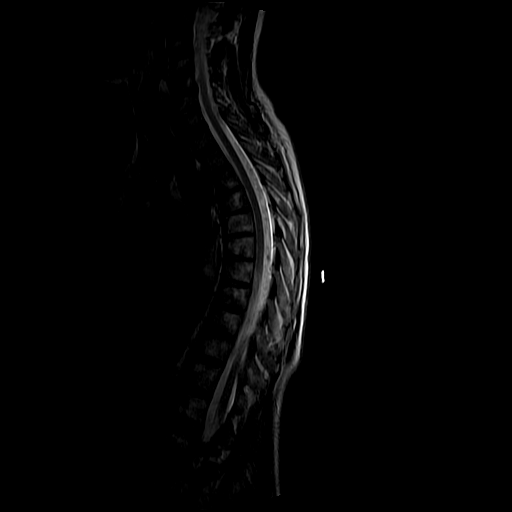
[im 9/13]
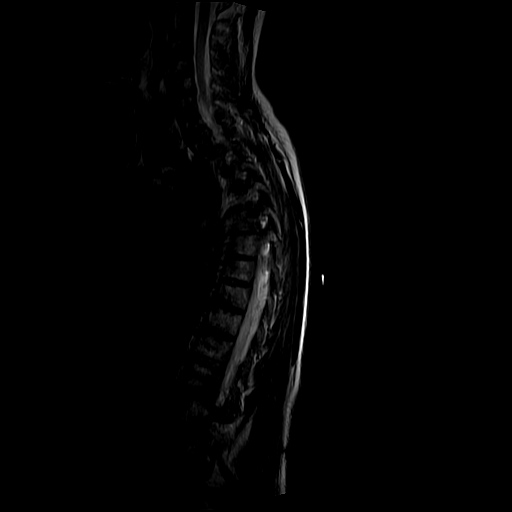
[im 11/13]
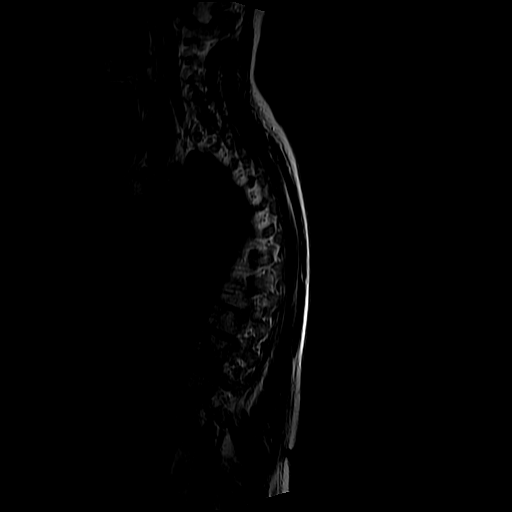
[im 13/13]
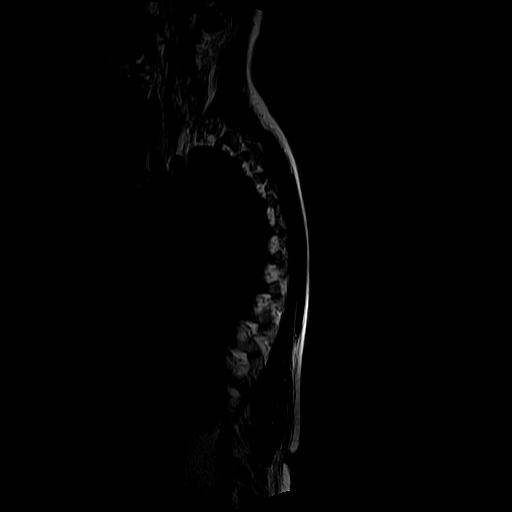

[Series 9: T1 · sagittal · 4.0mm · 0.78mm/px · 6 of 13 slices shown (1 of 2)]
[im 1/13]
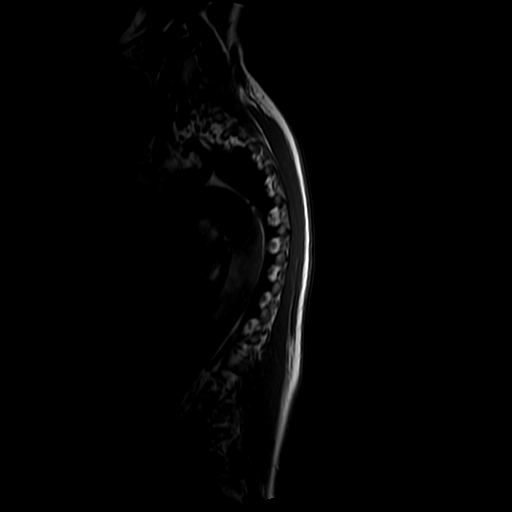
[im 2/13]
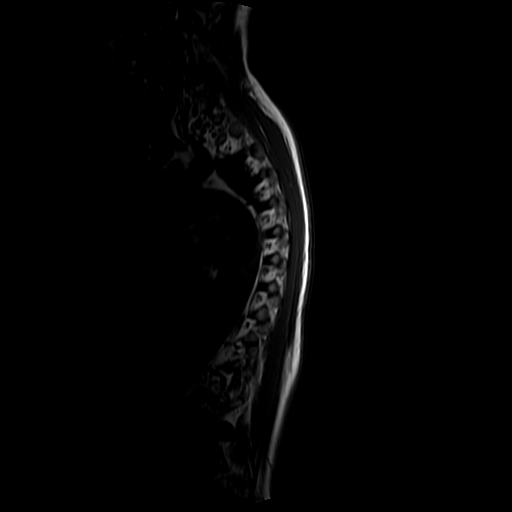
[im 5/13]
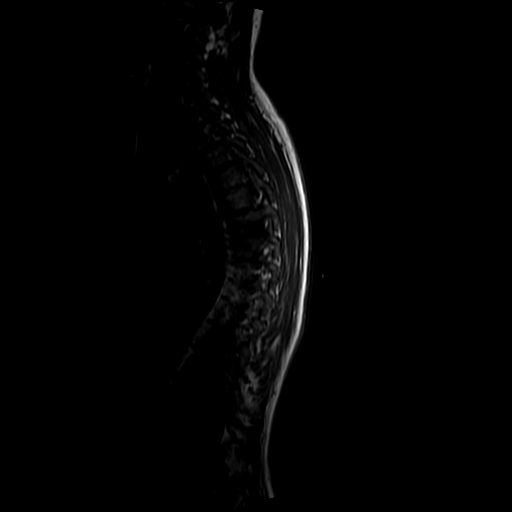
[im 6/13]
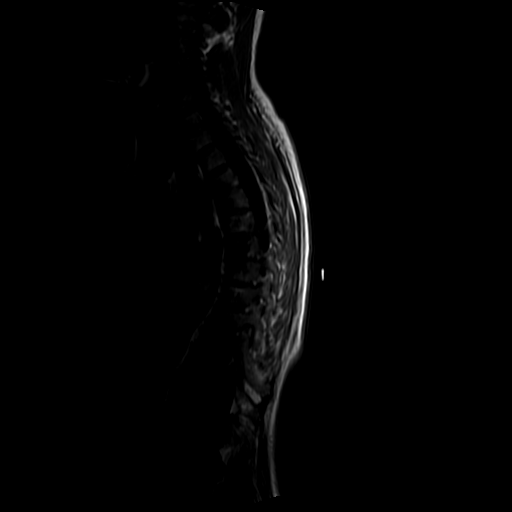
[im 7/13]
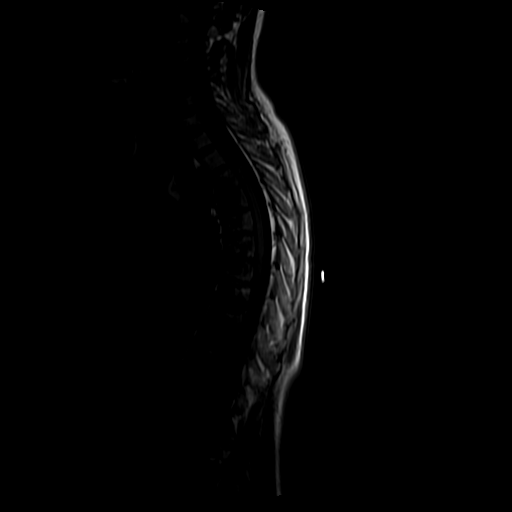
[im 11/13]
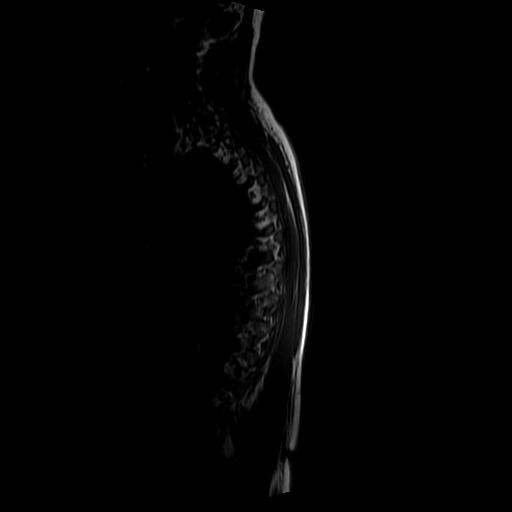

[Series 11: T2 · axial · 4.0mm · 0.62mm/px · z∈[-259,-141]mm · 9 of 12 slices shown (2 of 2)]
[im 1/12]
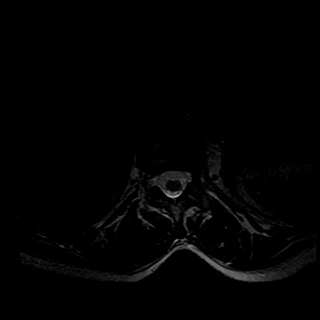
[im 2/12]
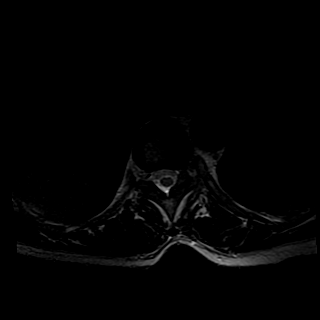
[im 3/12]
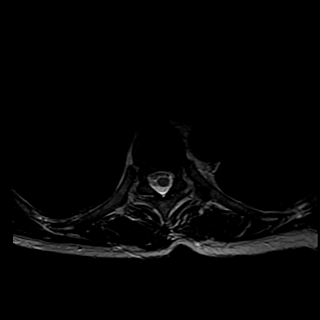
[im 5/12]
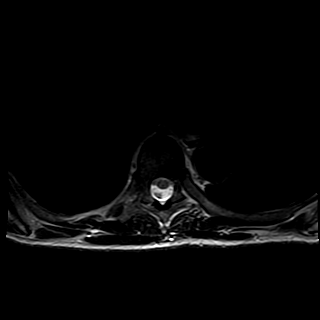
[im 6/12]
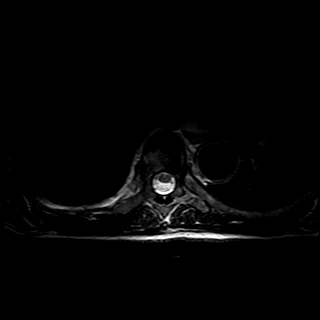
[im 7/12]
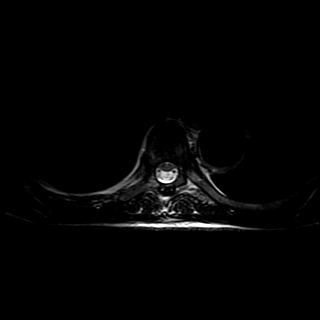
[im 9/12]
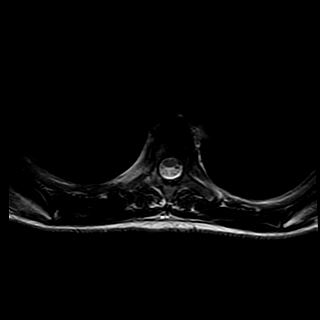
[im 10/12]
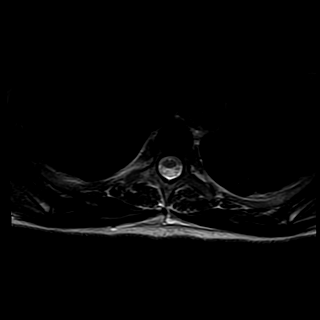
[im 12/12]
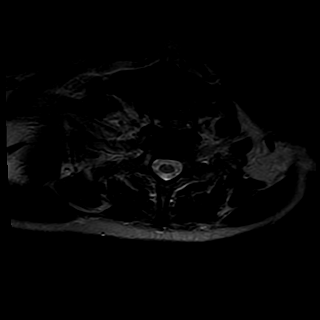

[Series 12: T1 · axial · 4.0mm · 0.62mm/px · z∈[-235,-149]mm · 3 of 12 slices shown (2 of 2)]
[im 2/12]
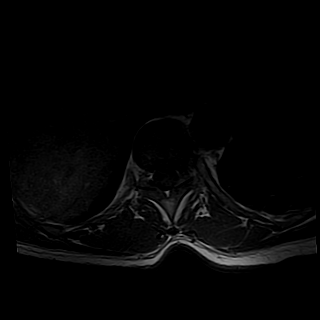
[im 6/12]
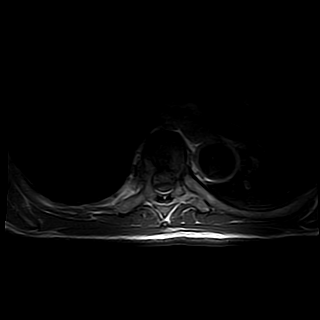
[im 10/12]
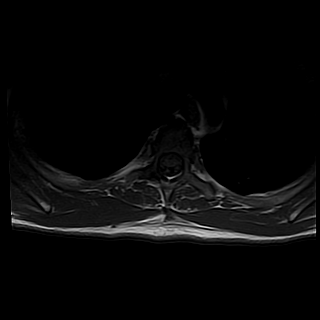

[26 of 48 positions shown; findings below may reference images not displayed]

FINDINGS: Scoliosis of thoracolumbar spine is noted again.  Diffuse T2 signal abnormality with increased T2 signal of mid and lower thoracic vertebrae and upper lumbar vertebrae are noted starting from T4 level to the upper lumbar spine.  There are no compression deformities.  These lesions correspond to the osteoblastic changes noted on the CT examination of the same areas on 05/20/2022.

No evidence of thoracic spinal stenosis is seen.  No foraminal stenosis is seen. Thoracic cord shows no focal abnormality.
IMPRESSION: 1. Diffuse osteoblastic changes of mid thoracic, lower thoracic vertebrae and lumbar vertebrae are noted similar to the findings noted on CT chest examination on 05/20/2022.  This once again raises suspicion of metastatic malignancy.  Further evaluation by total body nuclear bone scan is recommended.  Additional evaluation may be considered by imaging guided bone marrow biopsy.

2. No evidence of thoracic spinal stenosis or foraminal stenosis is noted. Thoracic spinal cord shows no focal abnormalities.

## 2023-01-29 IMAGING — DX XRAY CHEST 2 VIEWS
1 series · 2 of 2 positions shown · non-contrast
Comparison: CT chest dated 05/20/2022.

﻿EXAM:  [id number]   XRAY CHEST 2 VIEWS
INDICATION: Cough.
TECHNIQUE: Two views.

[Series 1: PA · 0.14mm/px · 2 of 2 slices shown]
[im 1/2]
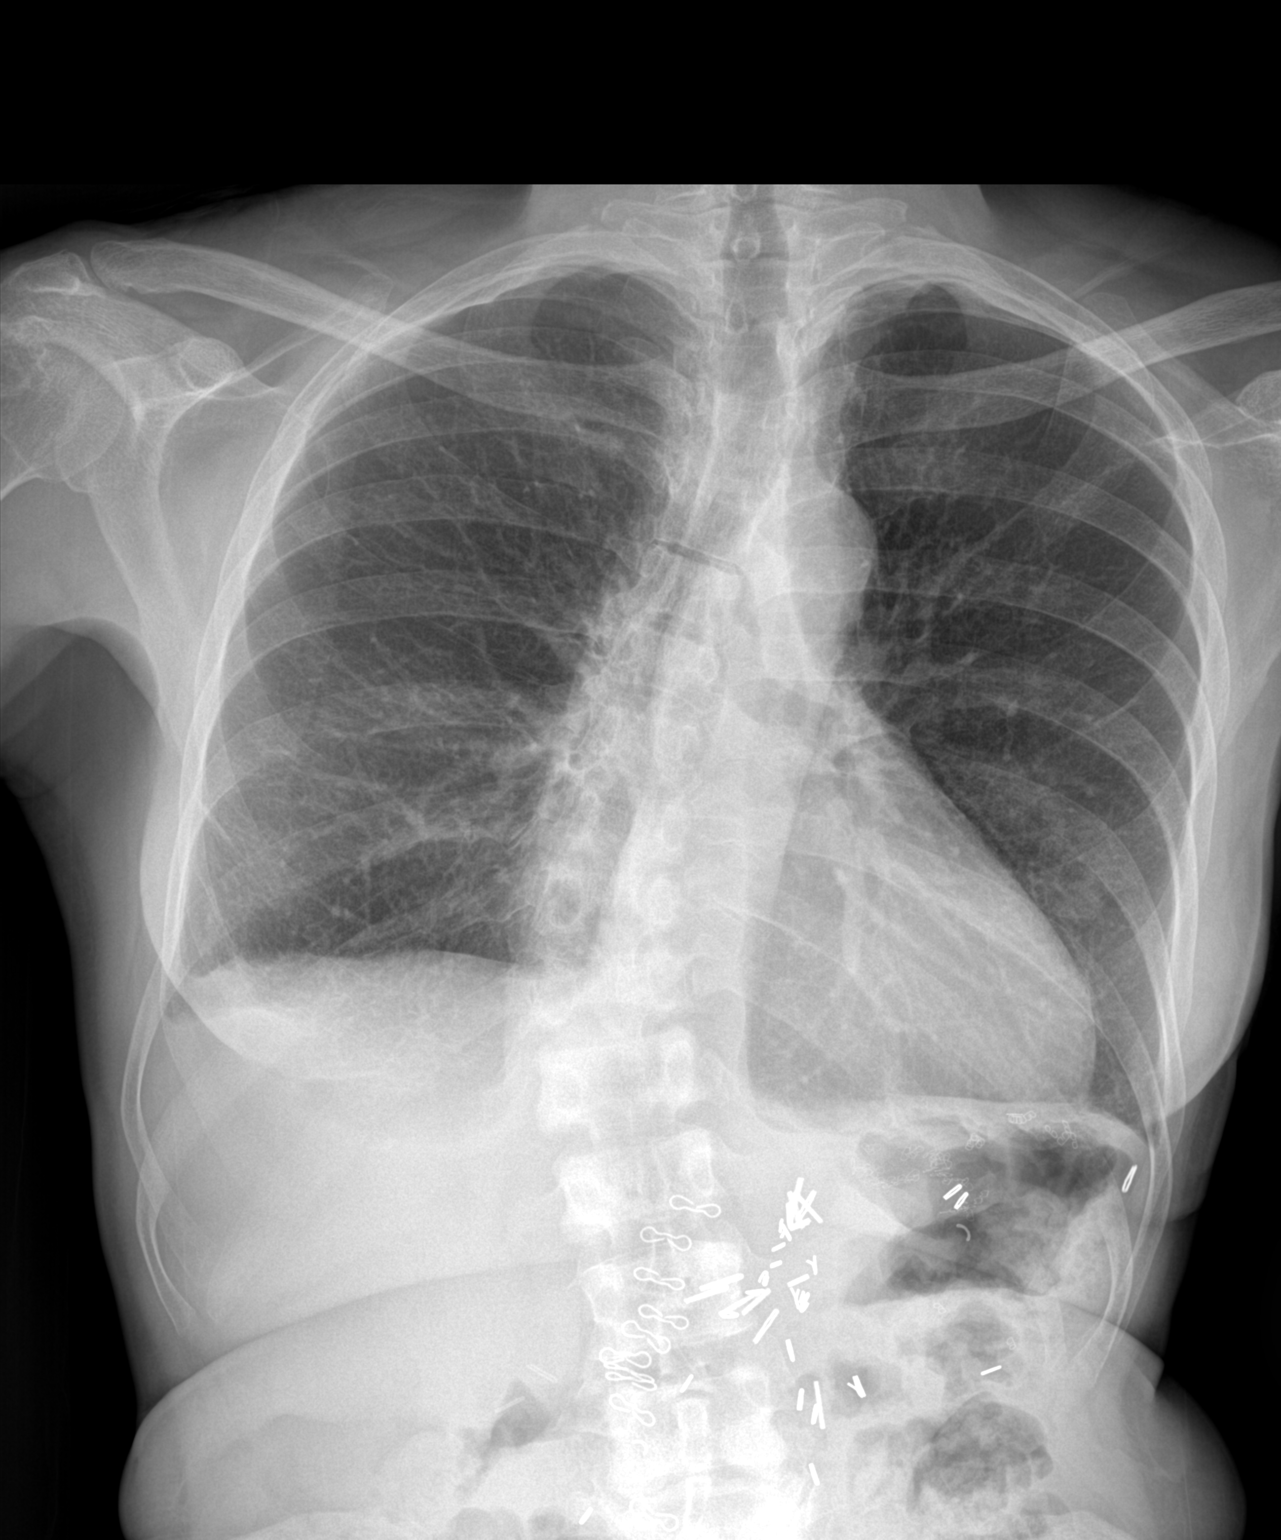
[im 2/2]
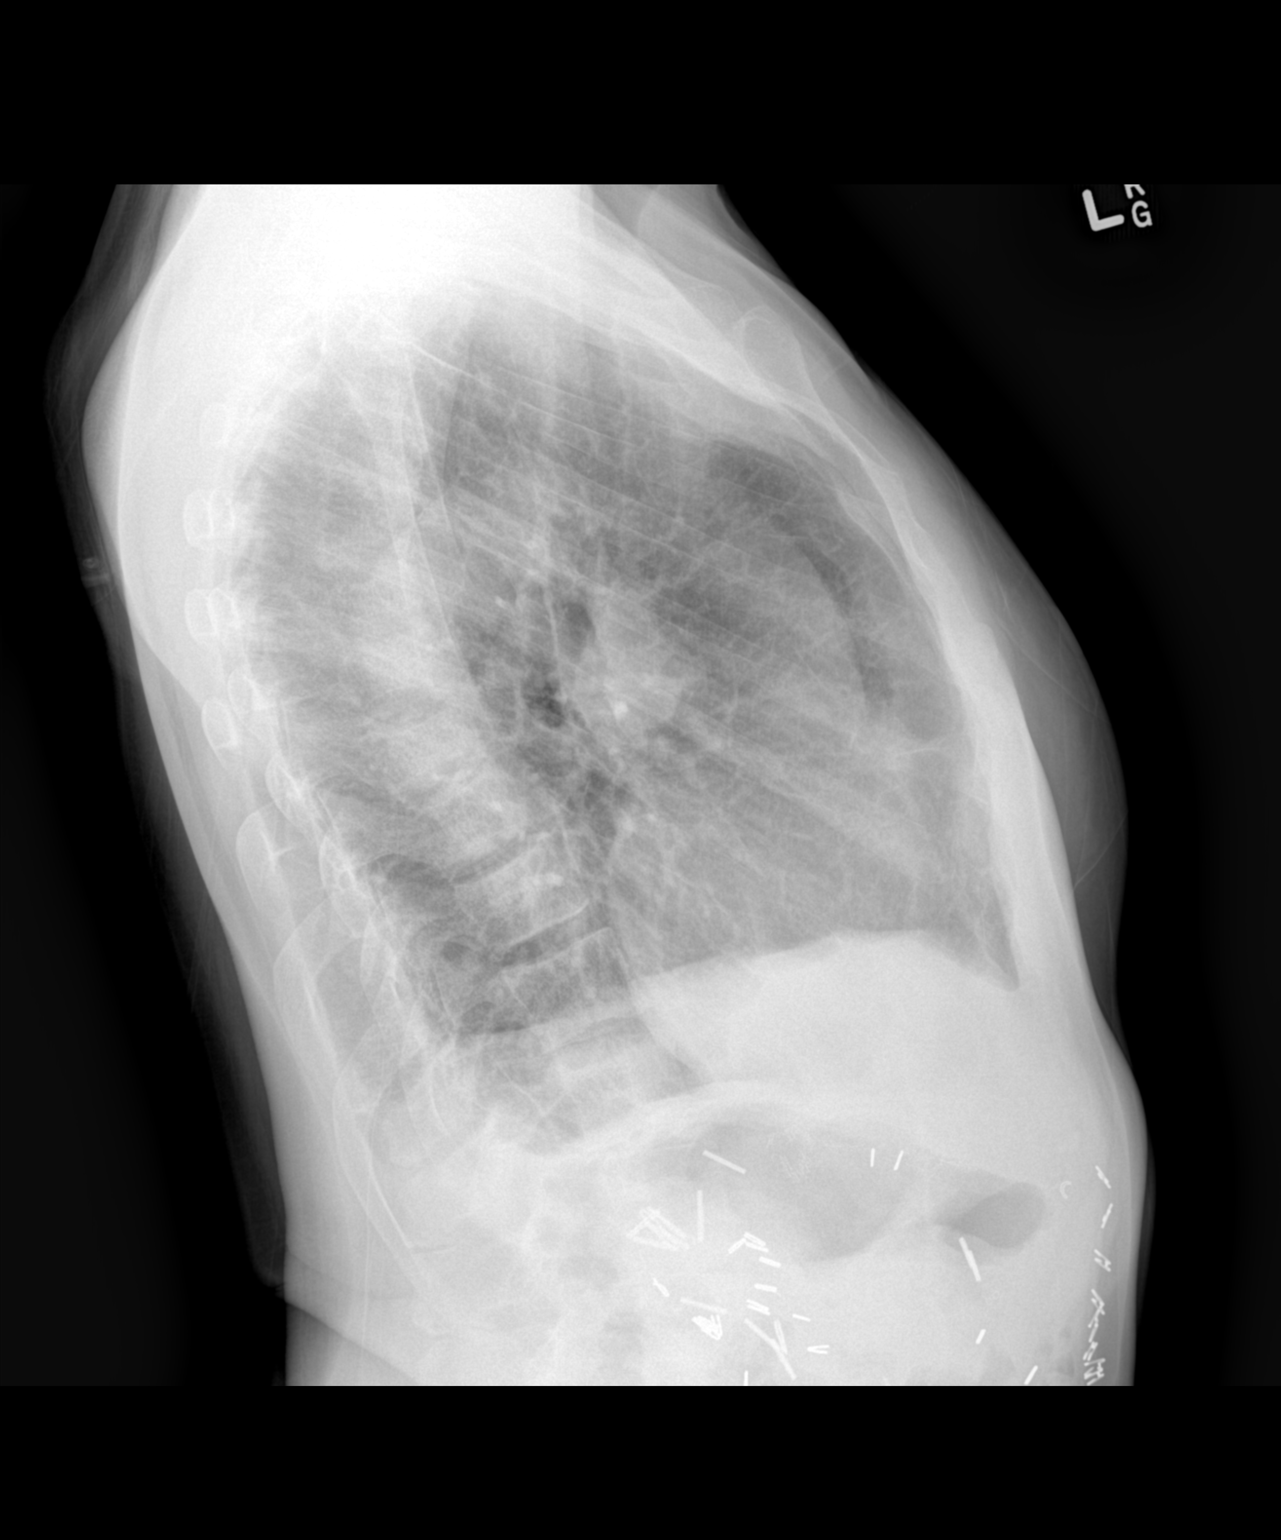

[2 of 2 positions shown; findings below may reference images not displayed]

FINDINGS: Cardiomegaly is noted. Lungs are emphysematous. No acute pulmonary lesions are seen. Significant scoliosis of the spine.
IMPRESSION: Emphysematous lungs. Scoliosis. No interval change from prior imaging study.

## 2023-01-29 IMAGING — MG 2D SCREENING MAMMO BIL
1 series · 4 of 4 positions shown · non-contrast
Comparison: Previous exams dated 11/08/2019, 08/19/2022.

------------- REPORT [id number] -------------
[facility] [place]
[address]
Raynard Alsup/MS/MRLEWIS, AUGUSTINE
We wish to report the following on your recent mammography examination. We are sending a report to your referring physician or other health care provider.
INDICATION: Screening mammogram.  Asymptomatic 61-year-old with no family history.  Lifetime breast cancer risk 7.3%.

[R CC · right · 0.10mm/px · 4 of 4 slices shown]
[im 1/4]
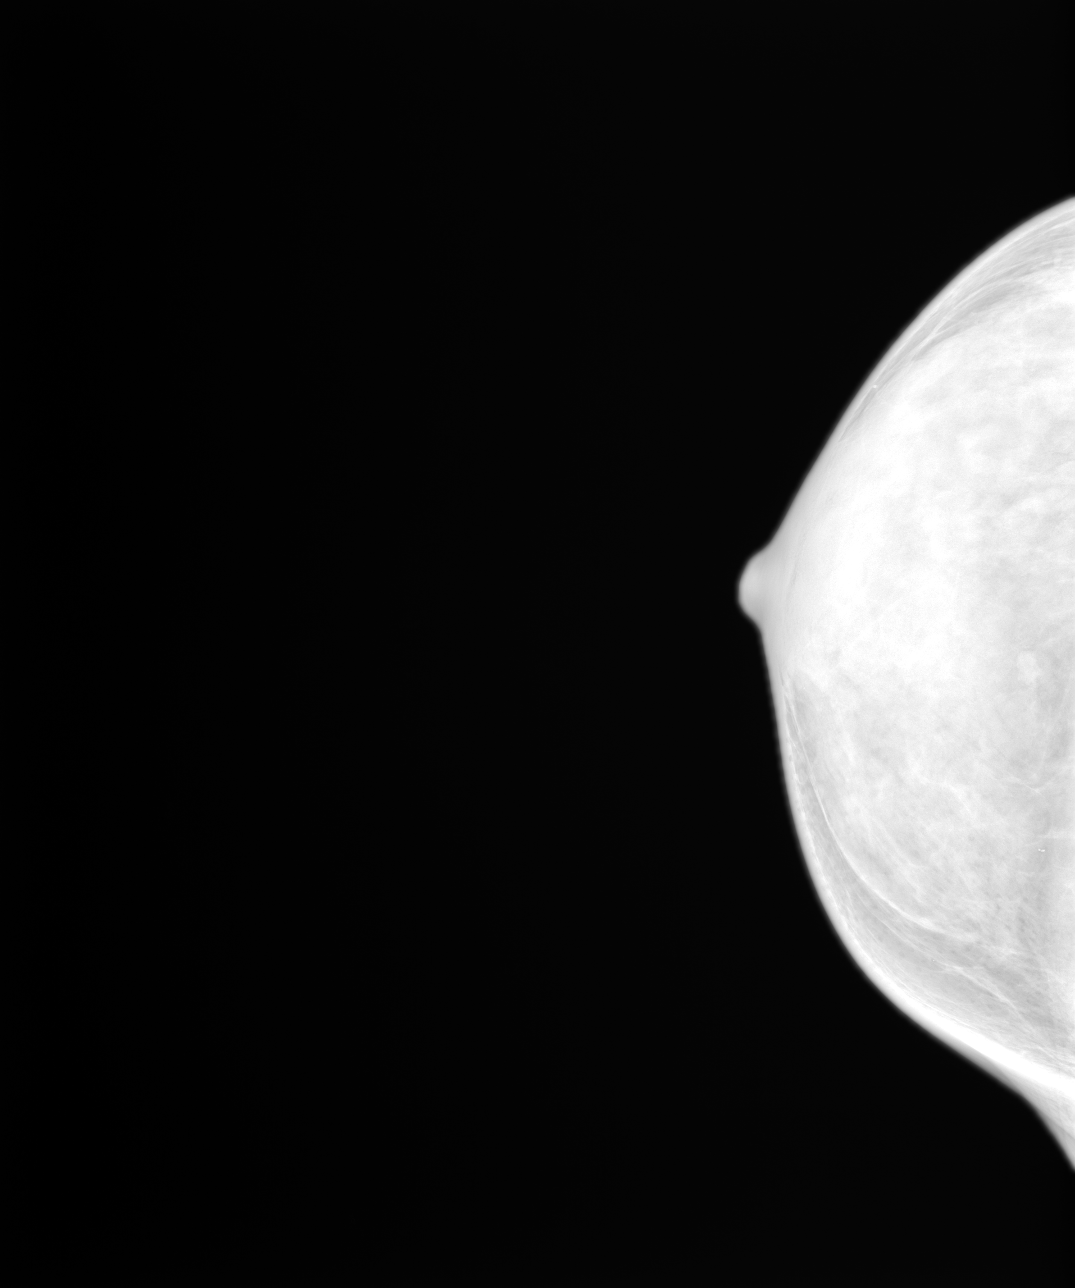
[im 2/4]
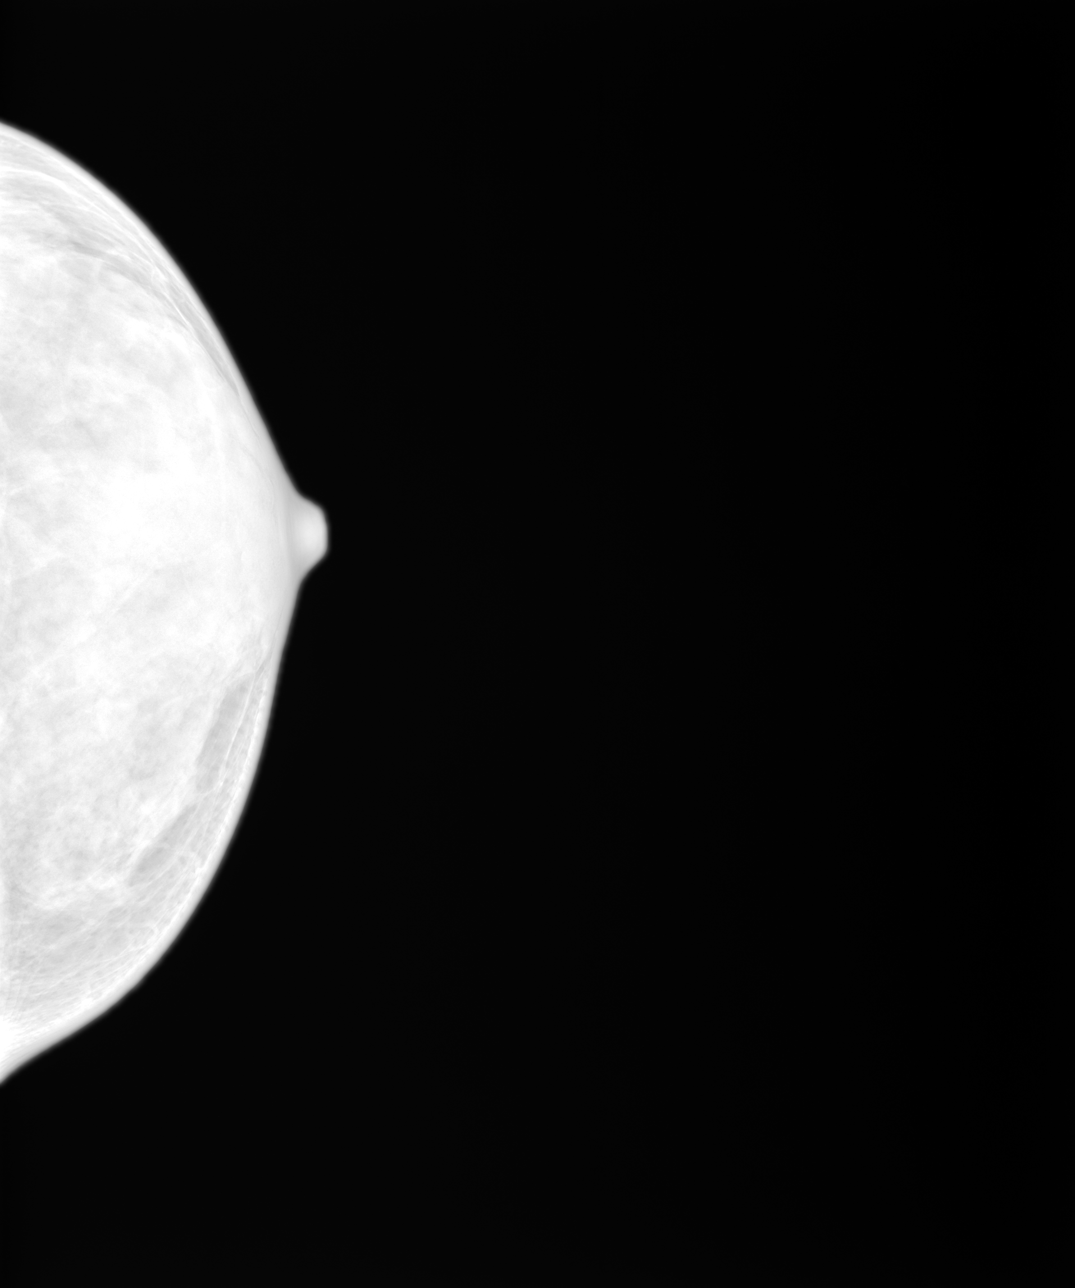
[im 3/4]
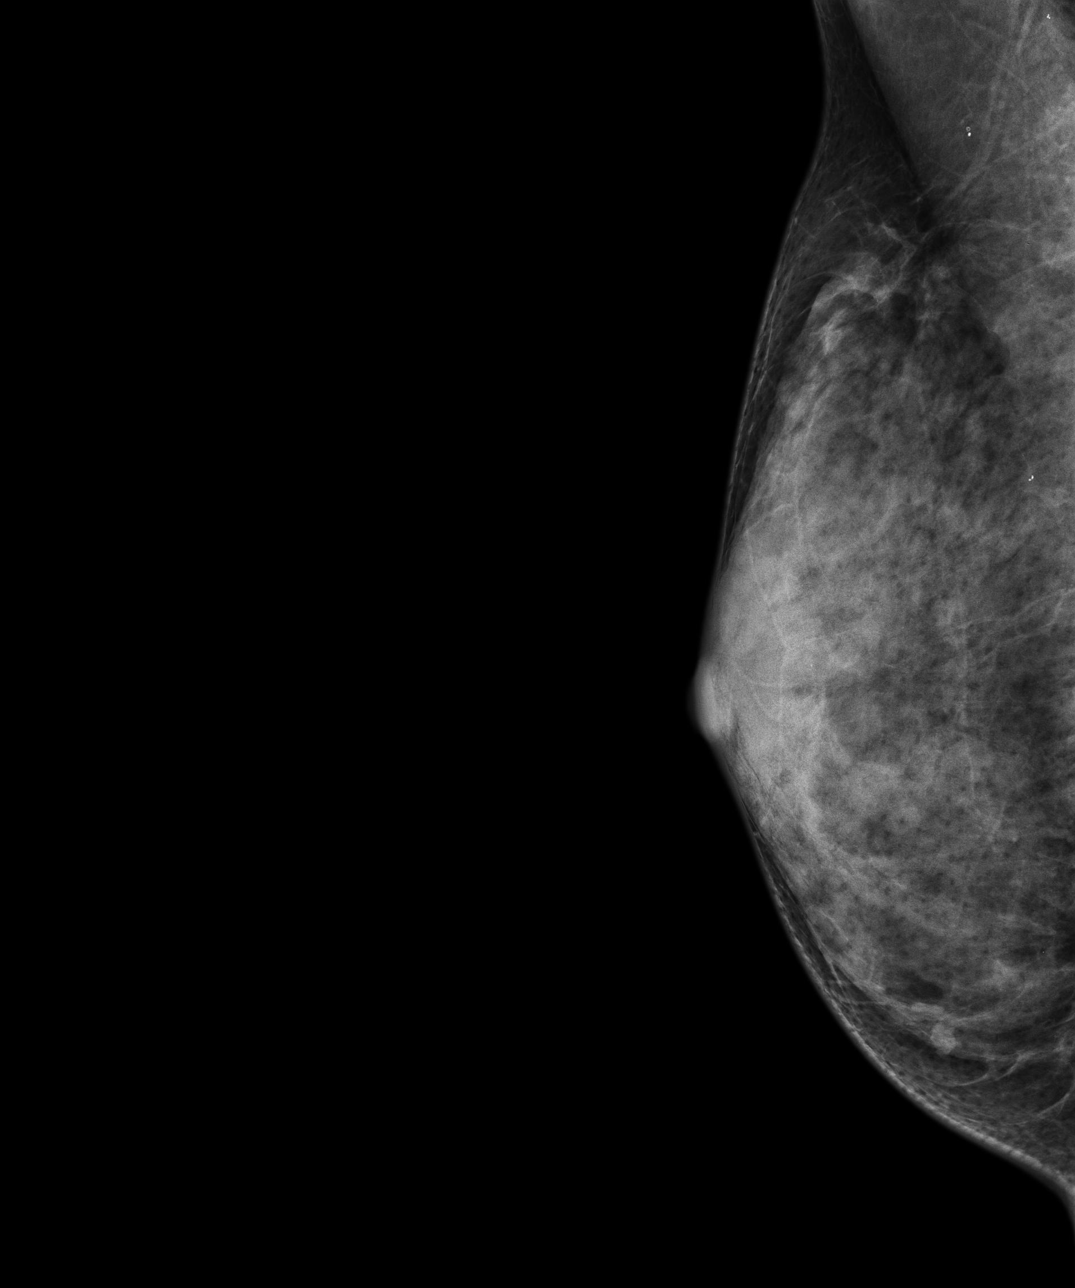
[im 4/4]
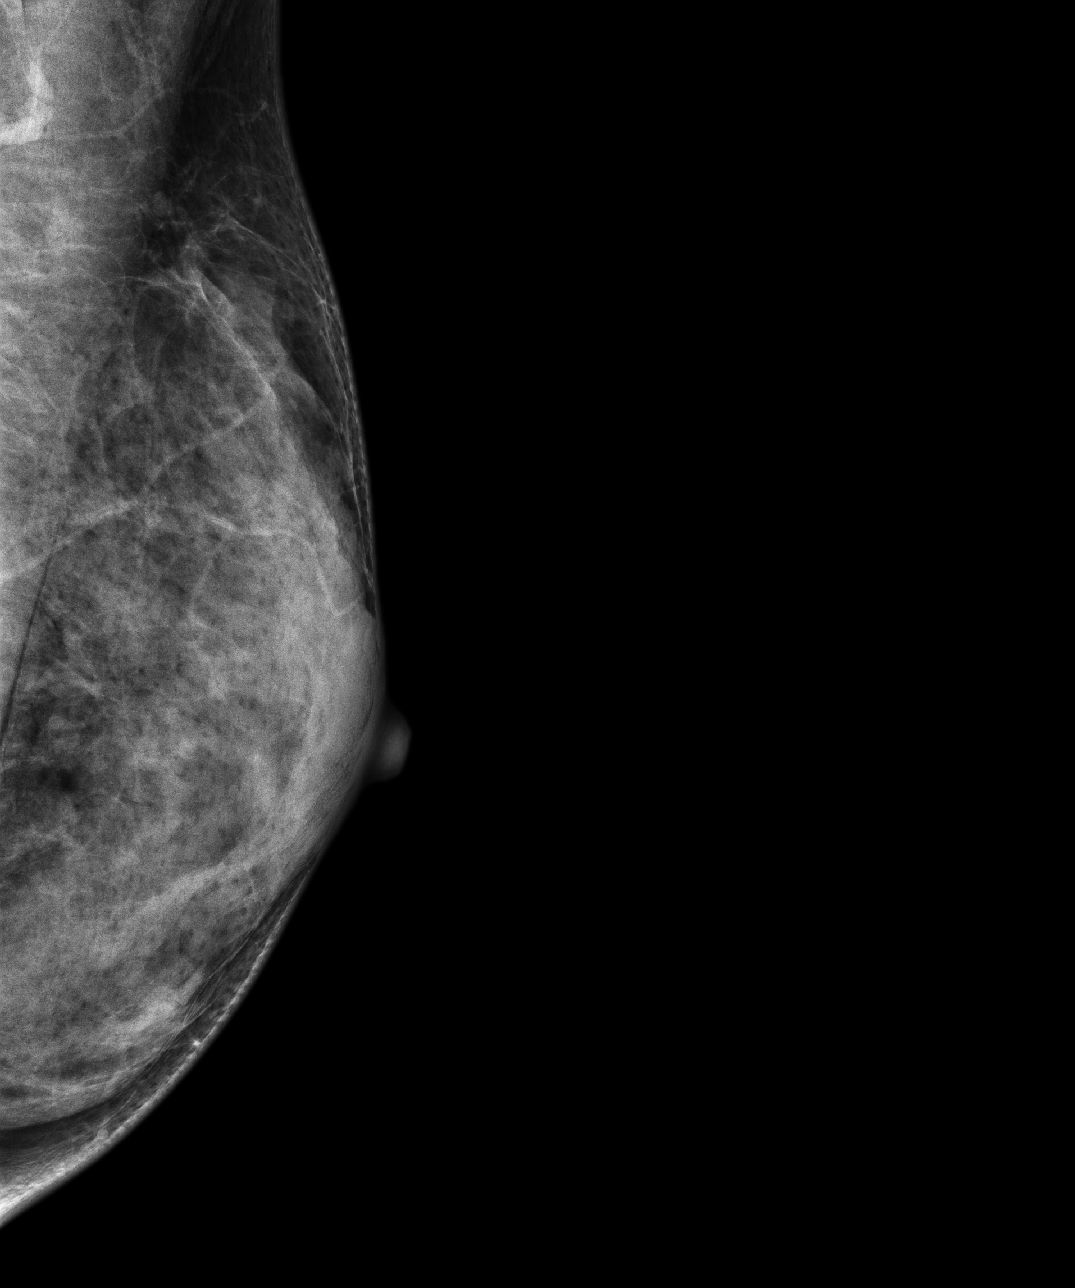

[4 of 4 positions shown; findings below may reference images not displayed]

FINDING: Normal/Negative-No evidence of cancer.

This statement is mandated by the [place], Department of Health.
Your examination was performed by one of our technologists, who are registered radiological technologists and also specially certified in mammography:
___
Tiger, Alreem (M)

Your mammogram was interpreted by our radiologist.

( 
Glemar Gallaza, M.D.

(Annual Breast Examination by a physician or other health care provider
(Annual Mammography Screening beginning at age 40
(Monthly Breast Self Examination

------------- REPORT [id number] -------------
﻿

EXAM:  2D SCREENING MAMMO BIL
FINDINGS: No new mass or architectural changes are noted.  No abnormal calcific densities, skin changes, nipple changes or duct dilation are seen.
IMPRESSION: 1.  BIRADS 2-Benign findings. Patient has been added in a reminder system with a target date for the next screening mammography.

2.  DENSITY CODE – D (Extremely dense breasts)  

Final Assessment Code:

BI-RADS 0
 Need additional imaging evaluation.

BI-RADS 1
 Negative mammogram.

BI-RADS 2
 Benign finding.

BI-RADS 3
 Probably benign finding; short-interval follow-up suggested.

BI-RADS 4
 Suspicious abnormality; biopsy should be considered.

BI-RADS 5
 Highly suggestive of malignancy; appropriate action should be taken.

BI-RADS 6
 Known biopsy-proven malignancy; appropriate action should be taken.

NOTE:
In compliance with Federal regulations, the results of this mammogram are being sent to the patient.

## 2023-02-04 ENCOUNTER — Telehealth (INDEPENDENT_AMBULATORY_CARE_PROVIDER_SITE_OTHER): Payer: Self-pay | Admitting: MEDICAL ONCOLOGY

## 2023-02-04 ENCOUNTER — Other Ambulatory Visit (INDEPENDENT_AMBULATORY_CARE_PROVIDER_SITE_OTHER): Payer: Self-pay | Admitting: MEDICAL ONCOLOGY

## 2023-02-04 DIAGNOSIS — M899 Disorder of bone, unspecified: Secondary | ICD-10-CM

## 2023-02-04 NOTE — Telephone Encounter (Signed)
Attempted to call both numbers listed for patient, unable to reach anyone or leave message. Called her next of kin, her brother Mr. Redd,he was able to take a message for her for appt next Thursday at 0830 with Dr. Alanson Puls, states she got a new phone number and he took our number and will have her call for any questions and directions to clinic.    In addition, we have been attempting to get a pathology from Dr. Sheran Lawless Le's office; no pathology available. A biopsy was recommended on her MRI that resulted on 01/20/23.

## 2023-02-10 ENCOUNTER — Encounter (INDEPENDENT_AMBULATORY_CARE_PROVIDER_SITE_OTHER): Payer: Self-pay | Admitting: NURSE PRACTITIONER

## 2023-02-11 ENCOUNTER — Encounter (INDEPENDENT_AMBULATORY_CARE_PROVIDER_SITE_OTHER): Payer: Self-pay | Admitting: MEDICAL ONCOLOGY

## 2023-02-11 ENCOUNTER — Other Ambulatory Visit: Payer: Self-pay

## 2023-02-11 ENCOUNTER — Ambulatory Visit: Payer: MEDICAID | Attending: MEDICAL ONCOLOGY | Admitting: MEDICAL ONCOLOGY

## 2023-02-11 ENCOUNTER — Telehealth (INDEPENDENT_AMBULATORY_CARE_PROVIDER_SITE_OTHER): Payer: Self-pay | Admitting: MEDICAL ONCOLOGY

## 2023-02-11 VITALS — BP 148/92 | HR 84 | Temp 97.8°F | Ht 65.0 in | Wt 126.9 lb

## 2023-02-11 DIAGNOSIS — C419 Malignant neoplasm of bone and articular cartilage, unspecified: Secondary | ICD-10-CM | POA: Insufficient documentation

## 2023-02-11 DIAGNOSIS — M549 Dorsalgia, unspecified: Secondary | ICD-10-CM | POA: Insufficient documentation

## 2023-02-11 DIAGNOSIS — M899 Disorder of bone, unspecified: Secondary | ICD-10-CM | POA: Insufficient documentation

## 2023-02-11 DIAGNOSIS — M546 Pain in thoracic spine: Secondary | ICD-10-CM

## 2023-02-11 DIAGNOSIS — R937 Abnormal findings on diagnostic imaging of other parts of musculoskeletal system: Secondary | ICD-10-CM

## 2023-02-11 NOTE — Nursing Note (Signed)
Received message from M. White, RT reporting that she spoke with Dr. Harolyn Rutherford and the MRI results that we have are poor quality and he is unable to use these images; he would like patient to have CT CAP and using these updated images to determine if he can perform a biopsy. Dr. Harolyn Rutherford also reports that if the CT CAP is not beneficial, the next step would be to repeat an MRI of thoracic and lumbar spine at [facility]. Informed Dr. Alanson Puls whom is agreeable to plan.

## 2023-02-11 NOTE — Telephone Encounter (Signed)
Called patient with appt for ct 3-29 at 11:30 and we are going to leave return appt where it is for now. Please do lab work two days prior to return appt.

## 2023-02-11 NOTE — Addendum Note (Signed)
Addended by: Joylene Grapes on: 02/11/2023 12:49 PM     Modules accepted: Orders

## 2023-02-11 NOTE — Addendum Note (Signed)
Addended by: Joylene Grapes on: 02/11/2023 12:58 PM     Modules accepted: Orders

## 2023-02-11 NOTE — Nursing Note (Signed)
Called [employer] plans to verify coverage for this patient, they told me that it was active and coverage had no deductables or copays, and covered 100%. At this time. Asked if they could send [place] something in writing stating that and she said that they have no way of doing that.
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
## 2023-02-12 ENCOUNTER — Ambulatory Visit (HOSPITAL_COMMUNITY): Payer: Self-pay

## 2023-02-12 ENCOUNTER — Other Ambulatory Visit (HOSPITAL_COMMUNITY): Payer: Self-pay

## 2023-02-12 ENCOUNTER — Other Ambulatory Visit: Payer: Self-pay

## 2023-02-12 ENCOUNTER — Other Ambulatory Visit (HOSPITAL_COMMUNITY): Payer: MEDICAID

## 2023-02-12 ENCOUNTER — Inpatient Hospital Stay
Admission: RE | Admit: 2023-02-12 | Discharge: 2023-02-12 | Disposition: A | Discharge status: Home / Self Care | Payer: MEDICAID | Source: Ambulatory Visit | Attending: MEDICAL ONCOLOGY | Admitting: MEDICAL ONCOLOGY

## 2023-02-12 ENCOUNTER — Other Ambulatory Visit (HOSPITAL_COMMUNITY): Payer: Self-pay | Admitting: Family Medicine

## 2023-02-12 ENCOUNTER — Inpatient Hospital Stay (HOSPITAL_COMMUNITY)
Admission: RE | Admit: 2023-02-12 | Discharge: 2023-02-12 | Disposition: A | Discharge status: Home / Self Care | Payer: MEDICAID | Source: Ambulatory Visit

## 2023-02-12 ENCOUNTER — Encounter (INDEPENDENT_AMBULATORY_CARE_PROVIDER_SITE_OTHER): Payer: Self-pay | Admitting: MEDICAL ONCOLOGY

## 2023-02-12 DIAGNOSIS — C419 Malignant neoplasm of bone and articular cartilage, unspecified: Secondary | ICD-10-CM | POA: Insufficient documentation

## 2023-02-12 DIAGNOSIS — J449 Chronic obstructive pulmonary disease, unspecified: Secondary | ICD-10-CM

## 2023-02-12 DIAGNOSIS — M5410 Radiculopathy, site unspecified: Secondary | ICD-10-CM | POA: Insufficient documentation

## 2023-02-12 DIAGNOSIS — M899 Disorder of bone, unspecified: Secondary | ICD-10-CM | POA: Insufficient documentation

## 2023-02-12 DIAGNOSIS — M069 Rheumatoid arthritis, unspecified: Secondary | ICD-10-CM

## 2023-02-12 DIAGNOSIS — M549 Dorsalgia, unspecified: Secondary | ICD-10-CM

## 2023-02-12 DIAGNOSIS — R042 Hemoptysis: Secondary | ICD-10-CM

## 2023-02-12 LAB — CREATININE WITH EGFR
CREATININE: 0.84 mg/dL (ref 0.60–1.30)
ESTIMATED GFR: 80 mL/min/{1.73_m2} (ref >59)

## 2023-02-12 MED ORDER — IOHEXOL 350 MG IODINE/ML INTRAVENOUS SOLUTION
75.0000 mL | INTRAVENOUS | Status: AC
Start: 2023-02-12 — End: 2023-02-12
  Administered 2023-02-12: 75 mL via INTRAVENOUS

## 2023-02-12 MED ORDER — BARIUM SULFATE 2 % (W/V) ORAL SUSPENSION
450.0000 mL | ORAL | Status: AC
Start: 2023-02-12 — End: 2023-02-12
  Administered 2023-02-12: 450 mL via ORAL

## 2023-02-15 ENCOUNTER — Other Ambulatory Visit (INDEPENDENT_AMBULATORY_CARE_PROVIDER_SITE_OTHER): Payer: Self-pay | Admitting: MEDICAL ONCOLOGY

## 2023-02-15 ENCOUNTER — Telehealth (INDEPENDENT_AMBULATORY_CARE_PROVIDER_SITE_OTHER): Payer: Self-pay | Admitting: MEDICAL ONCOLOGY

## 2023-02-15 ENCOUNTER — Encounter (INDEPENDENT_AMBULATORY_CARE_PROVIDER_SITE_OTHER): Payer: Self-pay | Admitting: MEDICAL ONCOLOGY

## 2023-02-15 DIAGNOSIS — C7951 Secondary malignant neoplasm of bone: Secondary | ICD-10-CM

## 2023-02-15 DIAGNOSIS — M899 Disorder of bone, unspecified: Secondary | ICD-10-CM

## 2023-02-15 NOTE — Telephone Encounter (Signed)
Called patient with appt for biopsy on 02-24-23 at 9:00 arrive at hospital at 7:30 nothing to eat or drink after midnight, stop taking the ASA on 02-17-23 and call Missy in CT to do pre interview on 4-9 between 6 and 2::30.

## 2023-02-16 ENCOUNTER — Encounter (INDEPENDENT_AMBULATORY_CARE_PROVIDER_SITE_OTHER): Payer: MEDICAID | Admitting: INTERVENTIONAL CARDIOLOGY

## 2023-02-23 ENCOUNTER — Telehealth (HOSPITAL_COMMUNITY): Payer: Self-pay | Admitting: Vascular & Interventional Radiology

## 2023-02-23 NOTE — OR PreOp (Signed)
Spoke with patient regarding IR procedure on 02/24/23.  NKDA.  Last dose of aspirin was 02/19/23. Not taking any injection medications. Instructed patient to take BP med in the morning with a sip of water-otherwise NPO after midnight.   No previous reaction to sedation or family history related to anesthesia.  Patient will have someone to drive her home after the procedure.

## 2023-02-24 ENCOUNTER — Encounter (HOSPITAL_COMMUNITY): Payer: Self-pay

## 2023-02-24 ENCOUNTER — Ambulatory Visit
Admission: RE | Admit: 2023-02-24 | Discharge: 2023-02-24 | Disposition: A | Discharge status: Home / Self Care | Payer: MEDICAID | Source: Ambulatory Visit | Attending: MEDICAL ONCOLOGY | Admitting: MEDICAL ONCOLOGY

## 2023-02-24 ENCOUNTER — Other Ambulatory Visit: Payer: Self-pay

## 2023-02-24 ENCOUNTER — Inpatient Hospital Stay (HOSPITAL_BASED_OUTPATIENT_CLINIC_OR_DEPARTMENT_OTHER)
Admission: RE | Admit: 2023-02-24 | Discharge: 2023-02-24 | Disposition: A | Discharge status: Home / Self Care | Payer: MEDICAID | Source: Ambulatory Visit | Attending: MEDICAL ONCOLOGY | Admitting: MEDICAL ONCOLOGY

## 2023-02-24 ENCOUNTER — Telehealth (INDEPENDENT_AMBULATORY_CARE_PROVIDER_SITE_OTHER): Payer: Self-pay | Admitting: MEDICAL ONCOLOGY

## 2023-02-24 DIAGNOSIS — C7951 Secondary malignant neoplasm of bone: Secondary | ICD-10-CM

## 2023-02-24 DIAGNOSIS — M899 Disorder of bone, unspecified: Secondary | ICD-10-CM

## 2023-02-24 DIAGNOSIS — C419 Malignant neoplasm of bone and articular cartilage, unspecified: Secondary | ICD-10-CM | POA: Insufficient documentation

## 2023-02-24 LAB — CBC WITH DIFF
BASOPHIL #: 0.1 10*3/uL (ref 0.00–0.10)
BASOPHIL %: 1 % (ref 0–1)
EOSINOPHIL #: 0.2 10*3/uL (ref 0.00–0.50)
EOSINOPHIL %: 1 %
HCT: 39.7 % (ref 31.2–41.9)
HGB: 13.1 g/dL (ref 10.9–14.3)
LYMPHOCYTE #: 4.1 10*3/uL — ABNORMAL HIGH (ref 1.00–3.00)
LYMPHOCYTE %: 35 % (ref 16–44)
MCH: 31.7 pg (ref 24.7–32.8)
MCHC: 33.1 g/dL (ref 32.3–35.6)
MCV: 95.9 fL — ABNORMAL HIGH (ref 75.5–95.3)
MONOCYTE #: 0.5 10*3/uL (ref 0.30–1.00)
MONOCYTE %: 4 % — ABNORMAL LOW (ref 5–13)
MPV: 7.4 fL — ABNORMAL LOW (ref 7.9–10.8)
NEUTROPHIL #: 6.9 10*3/uL (ref 1.85–7.80)
NEUTROPHIL %: 59 % (ref 43–77)
PLATELETS: 331 10*3/uL (ref 140–440)
RBC: 4.14 10*6/uL (ref 3.63–4.92)
RDW: 14.6 % (ref 12.3–17.7)
WBC: 11.8 10*3/uL (ref 3.8–11.8)

## 2023-02-24 LAB — COMPREHENSIVE METABOLIC PANEL, NON-FASTING
ALBUMIN/GLOBULIN RATIO: 0.9 (ref 0.8–1.4)
ALBUMIN: 4.2 g/dL (ref 3.5–5.7)
ALKALINE PHOSPHATASE: 136 U/L — ABNORMAL HIGH (ref 34–104)
ALT (SGPT): 46 U/L (ref 7–52)
ANION GAP: 8 mmol/L (ref 4–13)
AST (SGOT): 37 U/L (ref 13–39)
BILIRUBIN TOTAL: 0.5 mg/dL (ref 0.3–1.2)
BUN/CREA RATIO: 11 (ref 6–22)
BUN: 11 mg/dL (ref 7–25)
CALCIUM, CORRECTED: 9.4 mg/dL (ref 8.9–10.8)
CALCIUM: 9.6 mg/dL (ref 8.6–10.3)
CHLORIDE: 105 mmol/L (ref 98–107)
CO2 TOTAL: 28 mmol/L (ref 21–31)
CREATININE: 0.99 mg/dL (ref 0.60–1.30)
ESTIMATED GFR: 65 mL/min/{1.73_m2} (ref >59)
GLOBULIN: 4.5 (ref 2.9–5.4)
GLUCOSE: 139 mg/dL — ABNORMAL HIGH (ref 74–109)
OSMOLALITY, CALCULATED: 283 mOsm/kg (ref 270–290)
POTASSIUM: 4.2 mmol/L (ref 3.5–5.1)
PROTEIN TOTAL: 8.7 g/dL (ref 6.4–8.9)
SODIUM: 141 mmol/L (ref 136–145)

## 2023-02-24 MED ORDER — MIDAZOLAM 5 MG/ML INJECTION WRAPPER
INTRAMUSCULAR | Status: AC
Start: 2023-02-24 — End: 2023-02-24
  Filled 2023-02-24: qty 2

## 2023-02-24 MED ORDER — MIDAZOLAM 5 MG/ML INJECTION WRAPPER
Freq: Once | INTRAMUSCULAR | Status: AC | PRN
Start: 2023-02-24 — End: 2023-02-24
  Administered 2023-02-24: 2 mg via INTRAVENOUS
  Administered 2023-02-24: 1 mg via INTRAVENOUS

## 2023-02-24 MED ORDER — DENOSUMAB 120 MG/1.7 ML (70 MG/ML) SUBCUTANEOUS SOLUTION
120.0000 mg | Freq: Once | SUBCUTANEOUS | Status: AC
Start: 2023-02-24 — End: 2023-02-24
  Administered 2023-02-24: 120 mg via SUBCUTANEOUS
  Filled 2023-02-24: qty 1.7

## 2023-02-24 MED ORDER — LIDOCAINE HCL 20 MG/ML (2 %) INJECTION SOLUTION
Freq: Once | INTRAMUSCULAR | Status: AC | PRN
Start: 2023-02-24 — End: 2023-02-24
  Administered 2023-02-24: 10 mL via INTRADERMAL

## 2023-02-24 MED ORDER — SODIUM CHLORIDE 0.9 % INTRAVENOUS SOLUTION
INTRAVENOUS | Status: DC
Start: 2023-02-24 — End: 2023-02-25

## 2023-02-24 MED ORDER — FENTANYL (PF) 50 MCG/ML INJECTION WRAPPER
INJECTION | Freq: Once | INTRAMUSCULAR | Status: AC | PRN
Start: 2023-02-24 — End: 2023-02-24
  Administered 2023-02-24 (×3): 25 ug via INTRAVENOUS

## 2023-02-24 MED ORDER — FENTANYL (PF) 50 MCG/ML INJECTION SOLUTION
INTRAMUSCULAR | Status: AC
Start: 2023-02-24 — End: 2023-02-24
  Filled 2023-02-24: qty 4

## 2023-02-24 NOTE — Discharge Instructions (Signed)
SURGICAL DISCHARGE INSTRUCTIONS     Dr. * No surgeons listed *  performed your * No procedures listed * today at the [facility]    [facility]:  Monday through Friday from 8 a.m. - 4 p.m.: [phone]    For T&D: [phone]  Between 4 p.m. - 8 a.m., weekends and holidays:  Call ER [phone]    PLEASE SEE WRITTEN HANDOUTS AS DISCUSSED BY YOUR NURSE:  ***    SIGNS AND SYMPTOMS OF A WOUND / INCISION INFECTION   Be sure to watch for the following:  Increase in redness or red streaks near or around the wound or incision.  Increase in pain that is intense or severe and cannot be relieved by the pain medication that your doctor has given you.  Increase in swelling that cannot be relieved by elevation of a body part, or by applying ice, if permitted.  Increase in drainage, or if yellow / green in color and smells bad. This could be on a dressing or a cast.  Increase in fever for longer than 24 hours, or an increase that is higher than 101 degrees Fahrenheit (normal body temperature is 98 degrees Fahrenheit). The incision may feel warm to the touch.    **CALL YOUR DOCTOR IF ONE OR MORE OF THESE SIGNS / SYMPTOMS SHOULD OCCUR.    ANESTHESIA INFORMATION   ANESTHESIA -- ADULT PATIENTS:  You have received intravenous sedation / general anesthesia, and you may feel drowsy and light-headed for several hours. You may even experience some forgetfulness of the procedure. DO NOT DRIVE A MOTOR VEHICLE or perform any activity requiring complete alertness or coordination until you feel fully awake in about 24-48 hours. Do not drink alcoholic beverages for at least 24 hours. Do not stay alone, you must have a responsible adult available to be with you. You may also experience a dry mouth or nausea for 24 hours. This is a normal side effect and will disappear as the effects of the medication wear off.    REMEMBER   If you experience any difficulty breathing, chest pain, bleeding that you feel  is excessive, persistent nausea or vomiting or for any other concerns:  Call your physician Dr. DAVID GROTEN AT [phone] . You may also ask to have the general doctor on call paged. They are available to you 24 hours a day.      SPECIAL INSTRUCTIONS / COMMENTS   REST TODAY--DO NOT DRIVE OR OPERATE ELECTRICAL EQUIPMENT OR SIGN LEGAL DOCUMENTS FOR 24 HOURS  RETURN TO SEE ORDERING DOCTOR AS SCHEDULED      Dr David Groten [phone]

## 2023-02-24 NOTE — Brief Op Note (Signed)
02/24/23      Radiologist: Dr. Neomia Dear    Procedure: CT BIOPSY BONE    Description of Procedure Findings:        SCLEROTIC BONE LESION RIGHT ILIAC BONE      Estimated Blood Loss:  1.0 ML        Specimen Collected:  2 CORE BIOPSY SPECIMENS            Diagnosis: Lesion of bone of thoracic spine  Metastasis to bone (CMS HCC); AS ABOVE; DICTATED REPORT TO FOLLOW      Neomia Dear, MD

## 2023-02-24 NOTE — Nurses Notes (Signed)
[phone] Patient came to unit via w/c with husband for Xgeva injection. Xgeva given subcut to left arm. Tolerated well. Left unit via w/c with no c/o offered. Lovenia Kim, RN

## 2023-02-24 NOTE — Nurses Notes (Signed)
1025 PATIENT DISCHARGED TO ONCOLOGY FOR INJECTION  PATIENT ACCOMPANIED VIA W/C BY VOLUNTEER

## 2023-02-24 NOTE — Telephone Encounter (Signed)
Tried calling pt to confirm appt on 03-04-23 & needed to remind pt about labs needing to be done prior to appt.

## 2023-02-25 ENCOUNTER — Encounter (INDEPENDENT_AMBULATORY_CARE_PROVIDER_SITE_OTHER): Payer: Self-pay | Admitting: INTERVENTIONAL CARDIOLOGY

## 2023-03-04 ENCOUNTER — Other Ambulatory Visit: Payer: Self-pay

## 2023-03-04 ENCOUNTER — Ambulatory Visit: Payer: MEDICAID | Attending: MEDICAL ONCOLOGY | Admitting: MEDICAL ONCOLOGY

## 2023-03-04 ENCOUNTER — Encounter (INDEPENDENT_AMBULATORY_CARE_PROVIDER_SITE_OTHER): Payer: Self-pay | Admitting: MEDICAL ONCOLOGY

## 2023-03-04 VITALS — BP 151/90 | HR 93 | Temp 98.0°F | Ht 65.0 in | Wt 125.1 lb

## 2023-03-04 DIAGNOSIS — C7951 Secondary malignant neoplasm of bone: Secondary | ICD-10-CM | POA: Insufficient documentation

## 2023-03-04 DIAGNOSIS — C419 Malignant neoplasm of bone and articular cartilage, unspecified: Secondary | ICD-10-CM

## 2023-03-04 DIAGNOSIS — Z79620 Long term (current) use of immunosuppressive biologic: Secondary | ICD-10-CM | POA: Insufficient documentation

## 2023-03-04 DIAGNOSIS — M899 Disorder of bone, unspecified: Secondary | ICD-10-CM
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
## 2023-03-08 ENCOUNTER — Encounter (INDEPENDENT_AMBULATORY_CARE_PROVIDER_SITE_OTHER): Payer: Self-pay | Admitting: MEDICAL ONCOLOGY

## 2023-03-08 ENCOUNTER — Telehealth (INDEPENDENT_AMBULATORY_CARE_PROVIDER_SITE_OTHER): Payer: Self-pay | Admitting: MEDICAL ONCOLOGY

## 2023-03-08 ENCOUNTER — Other Ambulatory Visit (INDEPENDENT_AMBULATORY_CARE_PROVIDER_SITE_OTHER): Payer: Self-pay | Admitting: MEDICAL ONCOLOGY

## 2023-03-08 DIAGNOSIS — C419 Malignant neoplasm of bone and articular cartilage, unspecified: Secondary | ICD-10-CM

## 2023-03-08 LAB — SURGICAL PATHOLOGY SPECIMEN

## 2023-03-08 NOTE — Telephone Encounter (Signed)
Spoke to pt informed pt that [place] said her appt needs to be canceled until the biospy results come back. Then pt will be r/s

## 2023-03-08 NOTE — Nursing Note (Signed)
Called and informed patient that bone biopsy was sent for a second opinion at the [facility] and Dr. Ortencia Kick has reviewed the results and does not believe it was a good specimen and therefore, he wants her to have a repeat bone biopsy at another facility. Patient verbalized understanding and agreement to this and reports she does not have a preference on a facility, only that it be one of the closest. Referral sent to IR at [facility] for repeat bone biopsy. Darreld Mclean called and informed patient that f/u appt with Dr. Ortencia Kick for 03/11/23 has been cancelled and will reschedule once we have results of repeat biopsy. Called and informed Kayla at Dr. Daphine Deutscher office. Answered patient's questions to satisfaction. Encouraged to call for any questions or concerns as they arise.

## 2023-03-09 ENCOUNTER — Ambulatory Visit (INDEPENDENT_AMBULATORY_CARE_PROVIDER_SITE_OTHER): Payer: Self-pay | Admitting: MEDICAL ONCOLOGY

## 2023-03-09 ENCOUNTER — Encounter (INDEPENDENT_AMBULATORY_CARE_PROVIDER_SITE_OTHER): Payer: Self-pay

## 2023-03-09 NOTE — Telephone Encounter (Addendum)
See MyChart messages for conversation with patient.    ----- Message from Arnold Long sent at 03/08/2023  4:34 PM EDT -----  PT has some  questions about supplements she can take, please call. Thank you

## 2023-03-10 ENCOUNTER — Ambulatory Visit (INDEPENDENT_AMBULATORY_CARE_PROVIDER_SITE_OTHER): Payer: Self-pay | Admitting: MEDICAL ONCOLOGY

## 2023-03-11 ENCOUNTER — Ambulatory Visit (INDEPENDENT_AMBULATORY_CARE_PROVIDER_SITE_OTHER): Payer: Self-pay | Admitting: MEDICAL ONCOLOGY

## 2023-03-16 ENCOUNTER — Ambulatory Visit (INDEPENDENT_AMBULATORY_CARE_PROVIDER_SITE_OTHER): Payer: MEDICAID | Admitting: NURSE PRACTITIONER

## 2023-03-16 ENCOUNTER — Encounter (INDEPENDENT_AMBULATORY_CARE_PROVIDER_SITE_OTHER): Payer: Self-pay

## 2023-03-17 ENCOUNTER — Encounter (INDEPENDENT_AMBULATORY_CARE_PROVIDER_SITE_OTHER): Payer: Self-pay

## 2023-03-17 ENCOUNTER — Telehealth (INDEPENDENT_AMBULATORY_CARE_PROVIDER_SITE_OTHER): Payer: Self-pay | Admitting: MEDICAL ONCOLOGY

## 2023-03-17 NOTE — Telephone Encounter (Signed)
Spoke to pt regarding r/s f/u appt to 03-25-23 @ 8:15. Informed pt that Dr. Ortencia Kick wanted to go over the bone bx report from [facility]

## 2023-03-22 ENCOUNTER — Encounter (INDEPENDENT_AMBULATORY_CARE_PROVIDER_SITE_OTHER): Payer: Self-pay

## 2023-03-24 ENCOUNTER — Ambulatory Visit (HOSPITAL_COMMUNITY): Payer: MEDICAID

## 2023-03-25 ENCOUNTER — Other Ambulatory Visit: Payer: Self-pay

## 2023-03-25 ENCOUNTER — Other Ambulatory Visit (HOSPITAL_COMMUNITY): Payer: Self-pay | Admitting: MEDICAL ONCOLOGY

## 2023-03-25 ENCOUNTER — Ambulatory Visit (HOSPITAL_COMMUNITY): Payer: MEDICAID

## 2023-03-25 ENCOUNTER — Ambulatory Visit: Payer: MEDICAID | Attending: MEDICAL ONCOLOGY | Admitting: MEDICAL ONCOLOGY

## 2023-03-25 ENCOUNTER — Encounter (INDEPENDENT_AMBULATORY_CARE_PROVIDER_SITE_OTHER): Payer: Self-pay | Admitting: MEDICAL ONCOLOGY

## 2023-03-25 VITALS — BP 145/81 | HR 87 | Temp 97.7°F | Ht 65.0 in | Wt 124.9 lb

## 2023-03-25 DIAGNOSIS — C419 Malignant neoplasm of bone and articular cartilage, unspecified: Secondary | ICD-10-CM | POA: Insufficient documentation

## 2023-03-25 DIAGNOSIS — M810 Age-related osteoporosis without current pathological fracture: Secondary | ICD-10-CM | POA: Insufficient documentation

## 2023-03-25 NOTE — Nursing Note (Signed)
Notified Eloy End, RN; Janene Harvey; and M. Barry Dienes that [place] changed to 60 mg every six months r/t bone biopsy negative for malignancy.

## 2023-03-25 NOTE — Addendum Note (Signed)
Addended by: Marvene Staff on: 03/25/2023 02:41 PM     Modules accepted: Orders
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
## 2023-04-07 ENCOUNTER — Telehealth (INDEPENDENT_AMBULATORY_CARE_PROVIDER_SITE_OTHER): Payer: Self-pay | Admitting: MEDICAL ONCOLOGY

## 2023-04-07 NOTE — Telephone Encounter (Signed)
Called patient to remind her of her appt on 04/13/23 @10 :30 am, she needs labs drawn prior to appt.

## 2023-04-08 ENCOUNTER — Ambulatory Visit (HOSPITAL_COMMUNITY): Payer: Self-pay

## 2023-04-08 ENCOUNTER — Telehealth (INDEPENDENT_AMBULATORY_CARE_PROVIDER_SITE_OTHER): Payer: Self-pay | Admitting: Registered Nurse
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
## 2023-04-13 ENCOUNTER — Ambulatory Visit (INDEPENDENT_AMBULATORY_CARE_PROVIDER_SITE_OTHER): Payer: Self-pay | Admitting: MEDICAL ONCOLOGY

## 2023-04-14 ENCOUNTER — Telehealth (INDEPENDENT_AMBULATORY_CARE_PROVIDER_SITE_OTHER): Payer: Self-pay | Admitting: MEDICAL ONCOLOGY

## 2023-04-14 NOTE — Telephone Encounter (Signed)
Called patient to let her known that her next Xgeva injection will be 08/26/23 @ 12:30

## 2023-04-22 ENCOUNTER — Ambulatory Visit (HOSPITAL_COMMUNITY): Admission: RE | Admit: 2023-04-22 | Payer: MEDICAID | Source: Ambulatory Visit

## 2023-04-28 ENCOUNTER — Other Ambulatory Visit (HOSPITAL_COMMUNITY): Payer: Self-pay

## 2023-04-29 ENCOUNTER — Other Ambulatory Visit: Payer: Self-pay

## 2023-04-29 ENCOUNTER — Other Ambulatory Visit (HOSPITAL_COMMUNITY): Payer: MEDICAID

## 2023-04-29 ENCOUNTER — Inpatient Hospital Stay
Admission: RE | Admit: 2023-04-29 | Discharge: 2023-04-29 | Disposition: A | Discharge status: Home / Self Care | Payer: MEDICAID | Source: Ambulatory Visit | Attending: MEDICAL ONCOLOGY

## 2023-04-29 DIAGNOSIS — D509 Iron deficiency anemia, unspecified: Secondary | ICD-10-CM

## 2023-04-29 DIAGNOSIS — C419 Malignant neoplasm of bone and articular cartilage, unspecified: Secondary | ICD-10-CM

## 2023-04-29 DIAGNOSIS — Z79899 Other long term (current) drug therapy: Secondary | ICD-10-CM | POA: Insufficient documentation

## 2023-04-29 DIAGNOSIS — E785 Hyperlipidemia, unspecified: Secondary | ICD-10-CM | POA: Insufficient documentation

## 2023-04-29 LAB — CBC WITH DIFF
BASOPHIL #: 0.1 10*3/uL (ref 0.00–0.10)
BASOPHIL %: 1 % (ref 0–1)
EOSINOPHIL #: 0.1 10*3/uL (ref 0.00–0.50)
EOSINOPHIL %: 1 %
HCT: 33.9 % (ref 31.2–41.9)
HGB: 11.5 g/dL (ref 10.9–14.3)
LYMPHOCYTE #: 3.3 10*3/uL — ABNORMAL HIGH (ref 1.00–3.00)
LYMPHOCYTE %: 31 % (ref 16–44)
MCH: 32 pg (ref 24.7–32.8)
MCHC: 33.9 g/dL (ref 32.3–35.6)
MCV: 94.2 fL (ref 75.5–95.3)
MONOCYTE #: 0.8 10*3/uL (ref 0.30–1.00)
MONOCYTE %: 8 % (ref 5–13)
MPV: 7.1 fL — ABNORMAL LOW (ref 7.9–10.8)
NEUTROPHIL #: 6.3 10*3/uL (ref 1.85–7.80)
NEUTROPHIL %: 59 % (ref 43–77)
PLATELETS: 344 10*3/uL (ref 140–440)
RBC: 3.6 10*6/uL — ABNORMAL LOW (ref 3.63–4.92)
RDW: 15.8 % (ref 12.3–17.7)
WBC: 10.6 10*3/uL (ref 3.8–11.8)

## 2023-04-29 LAB — LIPID PANEL
CHOL/HDL RATIO: 3
CHOLESTEROL: 137 mg/dL (ref <200)
HDL CHOL: 45 mg/dL (ref >=40)
LDL CALC: 73 mg/dL (ref 0–100)
TRIGLYCERIDES: 95 mg/dL (ref <=150)
VLDL CALC: 19 mg/dL (ref 0–50)

## 2023-04-29 LAB — COMPREHENSIVE METABOLIC PANEL, NON-FASTING
ALBUMIN/GLOBULIN RATIO: 1 (ref 0.8–1.4)
ALBUMIN: 3.9 g/dL (ref 3.5–5.7)
ALKALINE PHOSPHATASE: 87 U/L (ref 34–104)
ALT (SGPT): 9 U/L (ref 7–52)
ANION GAP: 6 mmol/L (ref 4–13)
AST (SGOT): 14 U/L (ref 13–39)
BILIRUBIN TOTAL: 0.5 mg/dL (ref 0.3–1.2)
BUN/CREA RATIO: 13 (ref 6–22)
BUN: 11 mg/dL (ref 7–25)
CALCIUM, CORRECTED: 9.5 mg/dL (ref 8.9–10.8)
CALCIUM: 9.4 mg/dL (ref 8.6–10.3)
CHLORIDE: 108 mmol/L — ABNORMAL HIGH (ref 98–107)
CO2 TOTAL: 26 mmol/L (ref 21–31)
CREATININE: 0.84 mg/dL (ref 0.60–1.30)
ESTIMATED GFR: 80 mL/min/{1.73_m2} (ref >59)
GLOBULIN: 3.8 (ref 2.9–5.4)
GLUCOSE: 93 mg/dL (ref 74–109)
OSMOLALITY, CALCULATED: 279 mOsm/kg (ref 270–290)
POTASSIUM: 3.8 mmol/L (ref 3.5–5.1)
PROTEIN TOTAL: 7.7 g/dL (ref 6.4–8.9)
SODIUM: 140 mmol/L (ref 136–145)

## 2023-04-29 LAB — URINALYSIS, MACROSCOPIC
BILIRUBIN: NEGATIVE mg/dL
BLOOD: NEGATIVE mg/dL
GLUCOSE: NEGATIVE mg/dL
KETONES: NEGATIVE mg/dL
LEUKOCYTES: 75 WBCs/uL — AB
NITRITE: NEGATIVE
PH: 6 (ref 5.0–9.0)
PROTEIN: 30 mg/dL — AB
SPECIFIC GRAVITY: 1.034 — ABNORMAL HIGH (ref 1.002–1.030)
UROBILINOGEN: NORMAL mg/dL

## 2023-04-29 LAB — VITAMIN D 25 TOTAL: VITAMIN D: 69 ng/mL (ref 30–100)

## 2023-04-29 LAB — IRON TRANSFERRIN AND TIBC
IRON (TRANSFERRIN) SATURATION: 19 % (ref 15–50)
IRON: 54 ug/dL (ref 50–212)
TOTAL IRON BINDING CAPACITY: 287 ug/dL (ref 250–450)
TRANSFERRIN: 205 mg/dL (ref 203–362)
UIBC: 233 ug/dL (ref 130–375)

## 2023-04-29 LAB — URINALYSIS, MICROSCOPIC
HYALINE CASTS: 1 /lpf — ABNORMAL HIGH (ref <0)
RBCS: 4 /hpf — ABNORMAL HIGH (ref <4)
SQUAMOUS EPITHELIAL: 11 /hpf (ref <28)
WBCS: 8 /hpf — ABNORMAL HIGH (ref <6)

## 2023-04-29 LAB — URIC ACID: URIC ACID: 4.3 mg/dL (ref 2.3–7.6)

## 2023-04-29 LAB — LDH: LDH: 166 U/L (ref 140–271)

## 2023-04-29 LAB — HGA1C (HEMOGLOBIN A1C WITH EST AVG GLUCOSE): HEMOGLOBIN A1C: 5.5 % (ref 4.0–6.0)

## 2023-04-29 LAB — THYROXINE, FREE (FREE T4): THYROXINE (T4), FREE: 1.05 ng/dL (ref 0.58–1.64)

## 2023-04-29 LAB — THYROID STIMULATING HORMONE (SENSITIVE TSH): TSH: 0.298 u[IU]/mL — ABNORMAL LOW (ref 0.450–5.330)

## 2023-04-30 LAB — TRAB (TSH RECEPTOR ANTIBODY): TSH (THYROTROPIN) RECEPTOR BINDING ANTIBODY: 1.1 IU/L (ref <=1.75)

## 2023-05-03 DIAGNOSIS — C419 Malignant neoplasm of bone and articular cartilage, unspecified: Secondary | ICD-10-CM

## 2023-05-04 ENCOUNTER — Other Ambulatory Visit: Payer: Self-pay

## 2023-05-04 ENCOUNTER — Ambulatory Visit (INDEPENDENT_AMBULATORY_CARE_PROVIDER_SITE_OTHER): Payer: Self-pay

## 2023-05-04 ENCOUNTER — Ambulatory Visit: Payer: MEDICAID | Attending: HEMATOLOGY-ONCOLOGY | Admitting: HEMATOLOGY-ONCOLOGY

## 2023-05-04 ENCOUNTER — Encounter (INDEPENDENT_AMBULATORY_CARE_PROVIDER_SITE_OTHER): Payer: Self-pay | Admitting: HEMATOLOGY-ONCOLOGY

## 2023-05-04 ENCOUNTER — Telehealth (INDEPENDENT_AMBULATORY_CARE_PROVIDER_SITE_OTHER): Payer: Self-pay | Admitting: HEMATOLOGY-ONCOLOGY

## 2023-05-04 VITALS — BP 154/91 | HR 74 | Temp 97.2°F | Ht 65.0 in | Wt 124.0 lb

## 2023-05-04 DIAGNOSIS — M899 Disorder of bone, unspecified: Secondary | ICD-10-CM | POA: Insufficient documentation

## 2023-05-04 DIAGNOSIS — R937 Abnormal findings on diagnostic imaging of other parts of musculoskeletal system: Secondary | ICD-10-CM | POA: Insufficient documentation

## 2023-05-04 NOTE — Progress Notes (Signed)
Department of Hematology/Oncology  Progress Note   Name: Rebecca Wall  HYQ:[id number]  Date of Birth: 11-Oct-1961  Encounter Date: 05/04/2023    REFERRING PROVIDER:  No referring provider defined for this encounter.    TELEMEDICINE DOCUMENTATION:  Patient Location:  [facility], [facility] outpatient Hematology/Oncology [address], [place] [address]  Patient/family aware of provider location:  yes  Patient/family consent for telemedicine:  yes    REASON FOR OFFICE VISIT:  Follow Up (Malignant neoplasm of bone with metastases/Lesion of bone of thoracic spine/)     HISTORY OF PRESENT ILLNESS:  Rebecca Wall is a 62 y.o. female who presents today for follow up of abnormal imaging studies.  She previously had a bone scan showing multiple areas of significant sclerosis.  She has also had a couple of biopsies performed, and no obvious area of malignancy has ever been found.    She was started on denosumab, and it was initially on a monthly basis but was recently changed to every 6 months.  She was also seen Radiation Oncology, and it was agreed that no malignancy appears to be present.    ROS:   Pertinent review of systems as discussed in HPI    HISTORY:  Past Medical History:   Diagnosis Date    Coronary artery disease     Depression     Essential hypertension     Generalized anxiety disorder     HLD (hyperlipidemia)          Past Surgical History:   Procedure Laterality Date    CARDIAC CATHETERIZATION  07/29/2015    HX CHOLECYSTECTOMY      HX COLONOSCOPY      HX NEPHRECTOMY      HX ROTATOR CUFF REPAIR  10/18/2014    SPLENECTOMY, TOTAL           Social History     Socioeconomic History    Marital status: Divorced     Spouse name: Not on file    Number of children: Not on file    Years of education: Not on file    Highest education level: Not on file   Occupational History    Not on file   Tobacco Use    Smoking status: Every Day     Current packs/day: 0.50     Average packs/day: 0.5 packs/day  for 40.2 years (20.1 ttl pk-yrs)     Types: Cigarettes     Start date: 02/11/1983    Smokeless tobacco: Never   Vaping Use    Vaping status: Never Used   Substance and Sexual Activity    Alcohol use: Never    Drug use: Not on file    Sexual activity: Not on file   Other Topics Concern    Not on file   Social History Narrative    Not on file     Social Determinants of Health     Financial Resource Strain: Not on file   Transportation Needs: Not on file   Social Connections: Not on file   Intimate Partner Violence: Not on file   Housing Stability: Not on file     Family Medical History:       Problem Relation (Age of Onset)    Bone cancer Mother    Cerebral Aneurysm Brother    Diabetes type I Father    Heart Disease Father    Hypertension (High Blood Pressure) Mother, Father, Brother    Prostate Cancer Father  Current Outpatient Medications   Medication Sig    amLODIPine (NORVASC) 5 mg Oral Tablet Take 1 Tablet (5 mg total) by mouth Once a day    aspirin (ECOTRIN) 81 mg Oral Tablet, Delayed Release (E.C.) Take 1 Tablet (81 mg total) by mouth Once a day    atorvastatin (LIPITOR) 20 mg Oral Tablet Take 1 Tablet (20 mg total) by mouth Once a day    bethanechol chloride (URECHOLINE) 5 mg Oral Tablet TAKE 1 TABLET BY MOUTH TWICE DAILY BEFORE BREAKFAST AND AT BEDTIME    budesonide-formoteroL (SYMBICORT) 160-4.5 mcg/actuation Inhalation oral inhaler Take 2 Puffs by inhalation Twice daily    diclofenac sodium (VOLTAREN) 1 % Gel Apply topically Four times a day    droNABinol (MARINOL) 2.5 mg Oral Capsule     ergocalciferol, vitamin D2, (DRISDOL) 1,250 mcg (50,000 unit) Oral Capsule     ferrous sulfate 325 mg (65 mg iron) Oral Tablet, Delayed Release (E.C.)     folic acid (FOLVITE) 1 mg Oral Tablet Take 1 Tablet (1 mg total) by mouth Once a day    gabapentin (NEURONTIN) 400 mg Oral Capsule Take 1 Capsule (400 mg total) by mouth    methotrexate 2.5 mg Oral tablet Take 5 Tablets (12.5 mg total) by mouth Every 7 days     naloxone (NARCAN) 4 mg per spray nasal spray     oxyCODONE-acetaminophen (PERCOCET) 10-325 mg Oral tablet TAKE 1 TABLET BY MOUTH EVERY 4 HOURS    polycarbophil calcium (FIBERCON) 625 mg Oral Tablet Take 2 Tablets (1,250 mg total) by mouth Four times a day    promethazine (PHENERGAN) 12.5 mg Oral Tablet     zolpidem (AMBIEN) 5 mg Oral Tablet      Allergies   Allergen Reactions    Ciprofloxacin Rash    Nsaids (Non-Steroidal Anti-Inflammatory Drug)        PHYSICAL EXAM:  Most Recent Vitals    Flowsheet Row Telemedicine from 05/04/2023 in Hematology/Oncology, [facility]   Temperature 36.2 C (97.2 F) filed at... 05/04/2023 0901   Heart Rate 74 filed at... 05/04/2023 0901   Respiratory Rate --   BP (Non-Invasive) 154/91 filed at... 05/04/2023 0901   SpO2 98 % filed at... 05/04/2023 0901   Height 1.651 m (5\' 5" ) filed at... 05/04/2023 0901   Weight 56.2 kg (124 lb) filed at... 05/04/2023 0901   BMI (Calculated) 20.68 filed at... 05/04/2023 0901   BSA (Calculated) 1.61 filed at... 05/04/2023 0901      ECOG Status: (0) Fully active, able to carry on all predisease performance without restriction   Physical Exam    DIAGNOSTIC DATA:  No results found for this or any previous visit (from the past [address] hour(s)).    LABS:   CBC  Diff   Lab Results   Component Value Date/Time    WBC 10.6 04/29/2023 08:54 AM    HGB 11.5 04/29/2023 08:54 AM    HCT 33.9 04/29/2023 08:54 AM    PLTCNT 344 04/29/2023 08:54 AM    RBC 3.60 (L) 04/29/2023 08:54 AM    MCV 94.2 04/29/2023 08:54 AM    MCHC 33.9 04/29/2023 08:54 AM    MCH 32.0 04/29/2023 08:54 AM    RDW 15.8 04/29/2023 08:54 AM    MPV 7.1 (L) 04/29/2023 08:54 AM    Lab Results   Component Value Date/Time    PMNS 59 04/29/2023 08:54 AM    LYMPHOCYTES 31 04/29/2023 08:54 AM    EOSINOPHIL 1 04/29/2023 08:54  AM    MONOCYTES 8 04/29/2023 08:54 AM    BASOPHILS 1 04/29/2023 08:54 AM    BASOPHILS 0.10 04/29/2023 08:54 AM    PMNABS 6.30 04/29/2023 08:54 AM    LYMPHSABS 3.30 (H) 04/29/2023  08:54 AM    EOSABS 0.10 04/29/2023 08:54 AM    MONOSABS 0.80 04/29/2023 08:54 AM            Comprehensive Metabolic Profile    Lab Results   Component Value Date    SODIUM 140 04/29/2023    POTASSIUM 3.8 04/29/2023    CHLORIDE 108 (H) 04/29/2023    CO2 26 04/29/2023    ANIONGAP 6 04/29/2023    BUN 11 04/29/2023    CREATININE 0.84 04/29/2023    ALBUMIN 3.9 04/29/2023    CALCIUM 9.4 04/29/2023    GLUCOSENF 93 04/29/2023    ALKPHOS 87 04/29/2023    ALT 9 04/29/2023    AST 14 04/29/2023    TOTBILIRUBIN 0.5 04/29/2023    TOTALPROTEIN 7.7 04/29/2023          BASIC METABOLIC PANEL  Lab Results   Component Value Date    SODIUM 140 04/29/2023    POTASSIUM 3.8 04/29/2023    CHLORIDE 108 (H) 04/29/2023    CO2 26 04/29/2023    ANIONGAP 6 04/29/2023    BUN 11 04/29/2023    CREATININE 0.84 04/29/2023    BUNCRRATIO 13 04/29/2023    GFR 80 04/29/2023    CALCIUM 9.4 04/29/2023    GLUCOSE Negative 04/29/2023    GLUCOSENF 93 04/29/2023           ASSESSMENT:  Problem List Items Addressed This Visit    None  Visit Diagnoses       Abnormal bone radiograph    -  Primary    Relevant Orders    NUC BONE SCAN WHOLE BODY WITH SPECT             ICD-10-CM    1. Abnormal bone radiograph  R93.7 NUC BONE SCAN WHOLE BODY WITH SPECT           PLAN:   1. All relevant medical records were reviewed including available pertinent provider notes, procedure notes, imaging, laboratory, and pathology.   2. All pertinent labs and/or imaging were reviewed with the patient.   3. Abnormal bone imaging:  The PET-CT scan did not show any areas of concern whatsoever.  She has also had negative biopsies.  The totality of the information sounds most consistent with some type of metabolic or other bone disorder which is not related to cancer.  She is on denosumab, and we discussed that it would be prudent to recheck a bone scan at the 1 year mark and determine whether there is improvement.  I discussed this at great length with the patient and she is agreeable to that  plan.    Carlynn Herald was given the chance to ask questions, and these were answered to their satisfaction. The patient is welcome to call with any questions or concerns in the meantime.     On the day of the encounter, a total of 35 minutes was spent on this patient encounter including review of historical information, examination, documentation and post-visit activities.   Return in about 7 months (around 11/25/2023).     Lupita Dawn, MD  05/04/2023 , 10:42  The patient was seen as part of a collaborative telemedicine service with Dr. Damita Lack who participated in the encounter by active presence via  approved video/audio means for portions of the encounter.  The patient's insurance company bears full legal and financial responsibility resulting from any deviations that they cause to my recommended treatment plan.   CC:  Mliss Sax, DO  [address] [facility]  [place] [address]    No referring provider defined for this encounter.    This note was partially generated using MModal Fluency Direct system, and there may be some incorrect words, spellings, and punctuation that were not noted in checking the note before saving.

## 2023-05-04 NOTE — Telephone Encounter (Signed)
Called patient to inform her of the instructions for her Nuclear Med body scan scheduled on 11/19/23 she needs to be there at 10:30 am for injection then return at 2 pm for scan. I am mailing out reminder as well.

## 2023-05-05 NOTE — Nursing Note (Signed)
Patient completed psychosocial distress screening using the Enhanced NCCN Distress Thermometer (DT) Tool and self-reported an overall score of 7. Patient does not have active cancer diagnosis and does not require DTTS screening, however since patient did mark concerns, these were verbally discussed with patient. Patient deferred referral to mental health services at this time.

## 2023-05-24 NOTE — Progress Notes (Signed)
 Patient identified by name and DOB.  Medication reconciliation completed. Patient allergies reviewed.     There are no discontinued medications.          Fanny Skates, CMA 05/24/2023 11:31 AM

## 2023-05-24 NOTE — Progress Notes (Signed)
 Lumbar Consult Note:      HPI: Rebecca Wall is a 62 y.o. female patient who  presents at the kind referral of Conley Rolls, Hiep V, DO  [address] [facility]  [place],  [place] [address]  for evaluation of thoracic and lumbar spine.    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 Patient reports an additional biopsy that she reports as negative.     PET scan indicates increased tracer in Thoracic MRI (11/19/22). Other workup includes Lumbar MRI 02/12/23; C/A/P CT 02/12/23. Thorcic Spine MRI 01/20/23 and Bone Scan 11/19/22.    PET 05/03/23 reported as no definite abnormal osseus hypermetabolic findings.     She presents today with c/o neck pain with pain radiating into the right arm and shoulder, as well as pain in the right hip which greatly limits her ability to perform ADLs. Exacerbated by standing, activity and movement.         Chief Complaint   Patient presents with   . New Patient      Diffuse osteoblastic changes T/L spine   .      No past medical history on file.  No past surgical history on file.  Patient has no known allergies.    Current Outpatient Medications:   .  methotrexate 2.5 mg Tablet, TAKE 5 TABLETS BY MOUTH ONCE A WEEK, Disp: , Rfl:   .  oxyCODONE-acetaminophen (PERCOCET) 10-325 mg Tablet, take 1 tablet every 4 hours by mouth ., Disp: , Rfl:   .  ferrous sulfate 325 mg (65 mg iron) Tablet, Delayed Release (E.C.), , Disp: , Rfl:   .  calcium polycarbophil (FIBERCON) 625 mg Tablet, take 1,250 mg by mouth ., Disp: , Rfl:   .  lisinopriL (PRINIVIL) 5 mg Tablet, take 5 mg every day by mouth ., Disp: , Rfl:   .  gabapentin (NEURONTIN) 400 mg Capsule, take 400 mg by mouth two times daily, Disp: , Rfl:   .  ERGOCALCIFEROL, VITAMIN D2, (VITAMIN D2 PO), take by mouth every day, Disp: , Rfl:   .  folic acid (FOLVITE) 1 mg Tablet, take 1 mg by mouth every morning, Disp: , Rfl:   .  aspirin EC  81 mg Tablet, Delayed Release (E.C.), take 81 mg by mouth ., Disp: , Rfl:   .  sulfaSALAzine (AZULFIDINE) 500 mg Tablet, take 500 mg by mouth three times daily (Patient not taking: Reported on 05/24/2023.), Disp: , Rfl:   .  HYDROcodone-acetaminophen (NORCO) 5-325 mg Tablet, take 1 Tab by mouth every 6 hours as needed for Pain (Patient not taking: Reported on 05/24/2023.), Disp: , Rfl:   .  pantoprazole (PROTONIX) 40 mg Tablet, Delayed Release (E.C.), take 40 mg by mouth every day (Patient not taking: Reported on 05/24/2023.), Disp: , Rfl:   .  ranitidine (ZANTAC) 300 mg Tablet, take 300 mg by mouth every day (Patient not taking: Reported on 05/24/2023.), Disp: , Rfl:   Social History     Socioeconomic History   . Marital status: Divorced     Spouse name: Not on file   . Number of children: Not on file   . Years of education: Not on file   . Highest education level: Not on file   Occupational History   . Not on file   Tobacco Use   . Smoking status: Every Day     Types:  Cigarettes   . Smokeless tobacco: Current   Substance and Sexual Activity   . Alcohol use: Not on file   . Drug use: Not on file   . Sexual activity: Not on file   Other Topics Concern   . Not on file   Social History Narrative   . Not on file     Social Determinants of Health     Financial Resource Strain: Not on file   Food Insecurity: Not on file   Transportation Needs: Not on file   Physical Activity: Not on file   Stress: Not on file   Social Connections: Not on file   Intimate Partner Violence: Not on file   Housing Stability: Not on file     No family history on file.    ROS: Complete 14 point ROS- was obtained with positives in HPI, otherwise negative.    Ht 1.651 m (5\' 5" )   Wt 54.6 kg (120 lb 4.8 oz)   BMI 20.02 kg/m     Physical Exam:  General: Alert and oriented x3, no acute distress.   HEENT: Pupils equal, round bilaterally, no cervical lymphadenopathy, sclera/conjunctiva no injection or redness  GI: Soft, non-tender,  non-distended  Musculoskeletal:    Lumbar spine: No midline tenderness to palpation, range of motion normal   LLE: No deformities. No edema.   RLE: No deformities. No edema.   Pelvis: Stable. No SI joint tenderness to palpation.  Skin: No discoloration. Skin turgor normal.  Psychiatric: Mood and affect normal. Demonstrates understanding of situation.    Neurologic  UE Motor: Strength is symmetric and full in the deltoids, biceps, brachiolradialis, triceps, extensor carpi radialis, extensor carpi ulnaris, adductor digitiminimi and opponenspollicis.   LE Motor: Strength is symmetric and 5/5 in the iliopsoas, quadriceps, tibialis anterior, extensor hallucislongus, gastrocnemius and hamstrings.  Muscle Examination: Muscle bulk and tone are normal.  I see no fasciculations or atrophy.  Gait and Station: Walks without limp, list or pelvic tilt.  Sensation: Sensation is symmetric to touch and pinprick in the extremities bilaterally.   Reflexes:    UE: symmetric    LE: symmetric   Pathologic: No Hoffman's, Clonus or Babinski's    Imaging Studies: MRI L spine 02/12/23- NO central stenosis or evidence of fracture. Mottling of signal throughout the lumbar spine.   PET scan 04/2023- No definite hypermetabolic findings reported.   MRI C spine 02/2022- Disk herniationC/5 6 with central protrusion.    The radiological images were reviewed with the patient    Assessment/Plan:  62 y/o with diffuse lesions aross L spine, reported across T spine.  PET imaging non-conculsive, biopsies non- malignant.  C/o neck pain with right sided radiation into shoulder and arm.  Did nto bring T spine disk today.    Inconclusive results on nature of lesions.  Given ongoing neck and shoulder pain, will refer to my spine partner. MRI C spine in system  Will need to bring T spine MRI for review at next appointment.  Happy to see her back fo any further needs.       I have discussed the risks, benefits and alternatives according to my normal protocol, the  patient verbalizes an understanding and wishes to proceed.    I greatly appreciate the opportunity to care for this patient.

## 2023-07-01 ENCOUNTER — Other Ambulatory Visit: Payer: Self-pay

## 2023-07-01 ENCOUNTER — Inpatient Hospital Stay
Admission: RE | Admit: 2023-07-01 | Discharge: 2023-07-01 | Disposition: A | Discharge status: Still In Hospital | Payer: MEDICAID | Source: Ambulatory Visit | Attending: RADIATION ONCOLOGY | Admitting: RADIATION ONCOLOGY

## 2023-07-01 DIAGNOSIS — Z79899 Other long term (current) drug therapy: Secondary | ICD-10-CM | POA: Insufficient documentation

## 2023-07-01 DIAGNOSIS — M898X9 Other specified disorders of bone, unspecified site: Secondary | ICD-10-CM | POA: Insufficient documentation

## 2023-07-01 DIAGNOSIS — Z0389 Encounter for observation for other suspected diseases and conditions ruled out: Secondary | ICD-10-CM
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
## 2023-07-05 ENCOUNTER — Other Ambulatory Visit (HOSPITAL_BASED_OUTPATIENT_CLINIC_OR_DEPARTMENT_OTHER): Payer: Self-pay | Admitting: RADIATION ONCOLOGY

## 2023-07-05 DIAGNOSIS — M545 Low back pain, unspecified: Secondary | ICD-10-CM

## 2023-07-05 DIAGNOSIS — M546 Pain in thoracic spine: Secondary | ICD-10-CM

## 2023-07-12 ENCOUNTER — Encounter (INDEPENDENT_AMBULATORY_CARE_PROVIDER_SITE_OTHER): Payer: Self-pay | Admitting: MEDICAL ONCOLOGY

## 2023-07-12 NOTE — Addendum Note (Signed)
Encounter addended by: Molli Barrows, MD on: 07/12/2023 11:40 AM   Actions taken: Level of Service modified

## 2023-07-28 ENCOUNTER — Other Ambulatory Visit: Payer: Self-pay

## 2023-07-28 ENCOUNTER — Inpatient Hospital Stay
Admission: RE | Admit: 2023-07-28 | Discharge: 2023-07-28 | Disposition: A | Discharge status: Home / Self Care | Payer: MEDICAID | Source: Ambulatory Visit | Attending: RADIATION ONCOLOGY | Admitting: RADIATION ONCOLOGY

## 2023-07-28 ENCOUNTER — Other Ambulatory Visit (HOSPITAL_BASED_OUTPATIENT_CLINIC_OR_DEPARTMENT_OTHER): Payer: MEDICAID

## 2023-07-28 ENCOUNTER — Inpatient Hospital Stay (HOSPITAL_BASED_OUTPATIENT_CLINIC_OR_DEPARTMENT_OTHER)
Admission: RE | Admit: 2023-07-28 | Discharge: 2023-07-28 | Disposition: A | Discharge status: Home / Self Care | Payer: MEDICAID | Source: Ambulatory Visit | Attending: RADIATION ONCOLOGY

## 2023-07-28 DIAGNOSIS — M545 Low back pain, unspecified: Secondary | ICD-10-CM | POA: Insufficient documentation

## 2023-07-28 DIAGNOSIS — M546 Pain in thoracic spine: Secondary | ICD-10-CM | POA: Insufficient documentation

## 2023-07-28 LAB — CREATININE WITH EGFR
CREATININE: 0.92 mg/dL (ref 0.55–1.02)
ESTIMATED GFR: 71 mL/min/{1.73_m2} (ref >59)

## 2023-07-28 MED ORDER — IOHEXOL 350 MG IODINE/ML INTRAVENOUS SOLUTION
100.0000 mL | INTRAVENOUS | Status: AC
Start: 2023-07-28 — End: 2023-07-28
  Administered 2023-07-28: 100 mL via INTRAVENOUS

## 2023-08-04 ENCOUNTER — Inpatient Hospital Stay
Admission: RE | Admit: 2023-08-04 | Discharge: 2023-08-04 | Disposition: A | Discharge status: Still In Hospital | Payer: MEDICAID | Source: Ambulatory Visit | Attending: RADIATION ONCOLOGY | Admitting: RADIATION ONCOLOGY

## 2023-08-04 ENCOUNTER — Other Ambulatory Visit: Payer: Self-pay

## 2023-08-04 DIAGNOSIS — Z0389 Encounter for observation for other suspected diseases and conditions ruled out: Secondary | ICD-10-CM

## 2023-08-04 DIAGNOSIS — M898X9 Other specified disorders of bone, unspecified site: Secondary | ICD-10-CM | POA: Insufficient documentation

## 2023-08-04 DIAGNOSIS — Z129 Encounter for screening for malignant neoplasm, site unspecified: Secondary | ICD-10-CM | POA: Insufficient documentation

## 2023-08-04 MED ORDER — OXYCODONE-ACETAMINOPHEN 10 MG-325 MG TABLET
1.0000 | ORAL_TABLET | ORAL | 0 refills | Status: DC | PRN
Start: 2023-08-04 — End: 2023-08-04

## 2023-08-04 MED ORDER — OXYCODONE-ACETAMINOPHEN 10 MG-325 MG TABLET
1.0000 | ORAL_TABLET | ORAL | 0 refills | Status: AC | PRN
Start: 2023-08-04 — End: 2023-08-24

## 2023-08-04 NOTE — Addendum Note (Signed)
Encounter addended by: Molli Barrows, MD on: 08/04/2023 1:09 PM   Actions taken: Pharmacy for encounter modified, Order list changed

## 2023-08-04 NOTE — Progress Notes (Signed)
Name: Rebecca Wall MRN:  [id number]   Date: 08/04/2023 Age: 62 y.o.       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  Rebecca Wall continues to have severe bone pain and sees Dr. Damita Lack.  Rebecca Wall has had bone marrow biopsy and multiple tests that have failed to show any evidence of cancer.  Rebecca Wall prior scans have showed very worrisome findings in the bones.  At this time Rebecca Wall reports continued pain but no weight loss and good appetite.  Rebecca Wall reports continued musculoskeletal pain that is severe.  Rebecca Wall has no new dyspnea no new hemoptysis no difficulty with bowel troubles that is new.  Rebecca Wall had CT chest abdomen pelvis week ago.  These were personally reviewed and show no significant changes in the severe bony abnormalities had been previously noted.      PAST MEDICAL HISTORY    Past Medical History:   Diagnosis Date    Coronary artery disease     Depression     Essential hypertension     Generalized anxiety disorder     HLD (hyperlipidemia)         ALLERGIES:   Allergies   Allergen Reactions    Ciprofloxacin Rash    Nsaids (Non-Steroidal Anti-Inflammatory Drug)         MEDICATIONS:   Current Outpatient Medications   Medication Instructions    amLODIPine (NORVASC) 5 mg, Oral, DAILY    aspirin (ECOTRIN) 81 mg, Oral, DAILY    atorvastatin (LIPITOR) 20 mg, Oral, DAILY    bethanechol chloride (URECHOLINE) 5 mg Oral Tablet TAKE 1 TABLET BY MOUTH TWICE DAILY BEFORE BREAKFAST AND AT BEDTIME    budesonide-formoteroL (SYMBICORT) 160-4.5 mcg/actuation Inhalation oral inhaler 2 Puffs, Inhalation, 2 TIMES DAILY    diclofenac sodium (VOLTAREN) 1 % Gel Apply Topically, 4 TIMES DAILY    droNABinol (MARINOL) 2.5 mg Oral Capsule     ergocalciferol, vitamin D2, (DRISDOL) 1,250 mcg (50,000 unit) Oral Capsule     ferrous sulfate 325 mg (65 mg iron) Oral  Tablet, Delayed Release (E.C.)     folic acid (FOLVITE) 1 mg, Oral, DAILY    gabapentin (NEURONTIN) 400 mg, Oral    methotrexate 12.5 mg, Oral, EVERY 7 DAYS    naloxone (NARCAN) 4 mg per spray nasal spray     oxyCODONE-acetaminophen (PERCOCET) 10-325 mg Oral tablet 1 Tablet, Oral, EVERY 4 HOURS PRN    polycarbophil calcium (FIBERCON) 1,250 mg, Oral, 4 TIMES DAILY    promethazine (PHENERGAN) 12.5 mg Oral Tablet     zolpidem (AMBIEN) 5 mg Oral Tablet         INTERVAL SURGERIES:   Past Surgical History:   Procedure Laterality Date    CARDIAC CATHETERIZATION  07/29/2015    HX CHOLECYSTECTOMY      HX COLONOSCOPY      HX NEPHRECTOMY      HX ROTATOR CUFF REPAIR  10/18/2014    SPLENECTOMY, TOTAL          FAMILY HISTORY: Reviewed and unchanged    SOCIAL HISTORY: Reviewed and unchanged    REVIEW OF SYSTEMS  Pertinent review of systems as discussed in interval oncological history      PHYSICAL EXAMINATION  Physical Exam  Eyes:      Pupils: Pupils are equal, round, and reactive to  light.   Neurological:      General: No focal deficit present.      Mental Status: Rebecca Wall is alert and oriented to person, place, and time.   Psychiatric:         Mood and Affect: Mood normal.       ASSESSMENT & PLAN  Rebecca Wall is pleasant 62 year old with worrisome findings on imaging for metastatic disease but no diagnosis to this point.  Rebecca Wall has no new findings on Rebecca Wall CT scans.  I will order serum protein studies to rule out myeloma and see Rebecca Wall back afterwards to go over the results.  We will continue Rebecca Wall pain medication.  Time with patient 20 minutes.    Molli Barrows, MD  08/04/2023 11:11     [id number]@

## 2023-08-16 ENCOUNTER — Encounter (INDEPENDENT_AMBULATORY_CARE_PROVIDER_SITE_OTHER): Payer: Self-pay | Admitting: MEDICAL ONCOLOGY

## 2023-08-24 ENCOUNTER — Other Ambulatory Visit: Payer: MEDICAID | Attending: RADIATION ONCOLOGY

## 2023-08-24 ENCOUNTER — Other Ambulatory Visit: Payer: Self-pay

## 2023-08-24 ENCOUNTER — Encounter (INDEPENDENT_AMBULATORY_CARE_PROVIDER_SITE_OTHER): Payer: Self-pay | Admitting: MEDICAL ONCOLOGY

## 2023-08-24 DIAGNOSIS — Z85830 Personal history of malignant neoplasm of bone: Secondary | ICD-10-CM | POA: Insufficient documentation

## 2023-08-24 LAB — CBC WITH DIFF
BASOPHIL #: 0.1 10*3/uL (ref 0.00–0.10)
BASOPHIL %: 1 % (ref 0–1)
EOSINOPHIL #: 0.1 10*3/uL (ref 0.00–0.50)
EOSINOPHIL %: 1 % (ref 1–7)
HCT: 36.4 % (ref 31.2–41.9)
HGB: 12.1 g/dL (ref 10.9–14.3)
LYMPHOCYTE #: 3.8 10*3/uL — ABNORMAL HIGH (ref 1.00–3.00)
LYMPHOCYTE %: 40 % (ref 16–44)
MCH: 32.9 pg — ABNORMAL HIGH (ref 24.7–32.8)
MCHC: 33.2 g/dL (ref 32.3–35.6)
MCV: 99 fL — ABNORMAL HIGH (ref 75.5–95.3)
MONOCYTE #: 0.4 10*3/uL (ref 0.30–1.00)
MONOCYTE %: 4 % — ABNORMAL LOW (ref 5–13)
MPV: 7.3 fL — ABNORMAL LOW (ref 7.9–10.8)
NEUTROPHIL #: 5.2 10*3/uL (ref 1.85–7.80)
NEUTROPHIL %: 55 % (ref 43–77)
PLATELETS: 346 10*3/uL (ref 140–440)
RBC: 3.67 10*6/uL (ref 3.63–4.92)
RDW: 15.2 % (ref 12.3–17.7)
WBC: 9.5 10*3/uL (ref 3.8–11.8)

## 2023-08-25 LAB — PROTEIN FOR ELECTROPHORESIS: PROTEIN TOTAL: 7.5 g/dL (ref 5.6–7.6)

## 2023-08-25 LAB — BETA-2 MICROGLOBULIN (B2M), SERUM: BETA-2 MICROGLOBULIN, SERUM: 2.82 ug/mL — ABNORMAL HIGH (ref 0.80–2.34)

## 2023-08-25 LAB — MONOCLONAL GAMMOPATHY PROFILE WITH IMMUNOTYPING REFLEX
ALBUMIN: 3.7 g/dL (ref 3.4–4.8)
KAPPA FREE LIGHT CHAINS: 5.18 mg/dL — ABNORMAL HIGH (ref 1.25–3.25)
KAPPA/LAMBDA FLC RATIO: 2.26 — ABNORMAL HIGH (ref 0.80–2.10)
LAMBDA FREE LIGHT CHAINS: 2.29 mg/dL (ref 0.60–2.70)
TOTAL PROTEIN: 7.5 g/dL

## 2023-08-25 LAB — ALBUMIN FOR ELECTROPHORESIS: ALBUMIN: 3.7 g/dL (ref 3.4–4.8)

## 2023-08-25 LAB — KAPPA AND LAMBDA FREE LIGHT CHAINS, SERUM
KAPPA FREE LIGHT CHAINS: 5.18 mg/dL — ABNORMAL HIGH (ref 1.25–3.25)
KAPPA/LAMBDA FLC RATIO: 2.26 — ABNORMAL HIGH (ref 0.80–2.10)
LAMBDA FREE LIGHT CHAINS: 2.29 mg/dL (ref 0.60–2.70)

## 2023-08-26 ENCOUNTER — Ambulatory Visit (HOSPITAL_BASED_OUTPATIENT_CLINIC_OR_DEPARTMENT_OTHER): Payer: MEDICAID

## 2023-08-26 ENCOUNTER — Ambulatory Visit: Payer: MEDICAID

## 2023-08-27 ENCOUNTER — Ambulatory Visit
Admission: RE | Admit: 2023-08-27 | Discharge: 2023-08-27 | Disposition: A | Discharge status: Home / Self Care | Payer: MEDICAID | Source: Ambulatory Visit | Attending: HEMATOLOGY-ONCOLOGY | Admitting: HEMATOLOGY-ONCOLOGY

## 2023-08-27 ENCOUNTER — Other Ambulatory Visit: Payer: Self-pay

## 2023-08-27 VITALS — BP 150/84 | HR 78 | Temp 96.8°F | Resp 18

## 2023-08-27 DIAGNOSIS — C419 Malignant neoplasm of bone and articular cartilage, unspecified: Secondary | ICD-10-CM | POA: Insufficient documentation

## 2023-08-27 LAB — CORRECTED CALCIUM
ALBUMIN: 4.2 g/dL (ref 3.5–5.7)
CALCIUM, CORRECTED: 9.5 mg/dL (ref 8.9–10.8)
CALCIUM: 9.7 mg/dL (ref 8.6–10.3)

## 2023-08-27 LAB — CALCIUM: CALCIUM: 9.7 mg/dL (ref 8.6–10.3)

## 2023-08-27 LAB — ALBUMIN: ALBUMIN: 4.2 g/dL (ref 3.5–5.7)

## 2023-08-27 MED ORDER — DENOSUMAB 120 MG/1.7 ML (70 MG/ML) SUBCUTANEOUS SOLUTION
60.0000 mg | Freq: Once | SUBCUTANEOUS | Status: AC
Start: 2023-08-27 — End: 2023-08-27
  Administered 2023-08-27: 60 mg via SUBCUTANEOUS

## 2023-08-27 MED ORDER — DENOSUMAB 120 MG/1.7 ML (70 MG/ML) SUBCUTANEOUS SOLUTION
SUBCUTANEOUS | Status: AC
Start: 2023-08-27 — End: 2023-08-27
  Filled 2023-08-27: qty 1.7

## 2023-08-27 NOTE — Nurses Notes (Signed)
[id number]:  Arrival to unit today for labs and Xgeva injection, patient is alone and ambulatory.  Vital signs taken and elevated BP 150 systolic noted.  Labs drawn peripherally by lab personnel.  Xgeva 60 mg subcut given to right arm as ordered.  Injection tolerated well.  Patient left unit with next appt.  Sheppard Penton, RN

## 2023-08-30 ENCOUNTER — Other Ambulatory Visit: Payer: Self-pay

## 2023-08-30 ENCOUNTER — Inpatient Hospital Stay
Admission: RE | Admit: 2023-08-30 | Discharge: 2023-08-30 | Disposition: A | Discharge status: Still In Hospital | Payer: MEDICAID | Source: Ambulatory Visit | Attending: RADIATION ONCOLOGY | Admitting: RADIATION ONCOLOGY

## 2023-08-30 MED ORDER — OXYCODONE-ACETAMINOPHEN 10 MG-325 MG TABLET
1.0000 | ORAL_TABLET | ORAL | 0 refills | Status: DC | PRN
Start: 2023-08-30 — End: 2023-09-30

## 2023-08-30 NOTE — Progress Notes (Signed)
Name: Rebecca Wall MRN:  [id number]   Date: 08/30/2023 Age: 62 y.o.       TUMOR:  Suspected metastatic cancer    TREATMENT INTERVAL:  No prior treatment    INTERVAL ONCOLOGIC HISTORY:  Rebecca, Wall is a pleasant 62 year old who has been under workup for suspected metastatic cancer since earlier this year.  She continues to have severe bone pain and sees Dr. Damita Wall.  She has had bone marrow biopsy and multiple tests that have failed to show any evidence of cancer.  Her prior scans have showed very worrisome findings in the bones.  At this time she reports continued pain but no weight loss and good appetite.  She reports continued musculoskeletal pain that is severe.  She has no new dyspnea no new hemoptysis no difficulty with bowel troubles that is new.  That shows slightly elevated beta 2 microglobulin and slightly elevated kappa fraction but no M chains, pathologist reviewed suggested is not consistent with myeloma.    PAST MEDICAL HISTORY    Past Medical History:   Diagnosis Date    Coronary artery disease     Depression     Essential hypertension     Generalized anxiety disorder     HLD (hyperlipidemia)         ALLERGIES:   Allergies   Allergen Reactions    Ciprofloxacin Rash    Nsaids (Non-Steroidal Anti-Inflammatory Drug)         MEDICATIONS:   Current Outpatient Medications   Medication Instructions    amLODIPine (NORVASC) 5 mg, Oral, DAILY    aspirin (ECOTRIN) 81 mg, Oral, DAILY    atorvastatin (LIPITOR) 20 mg, Oral, DAILY    bethanechol chloride (URECHOLINE) 5 mg Oral Tablet TAKE 1 TABLET BY MOUTH TWICE DAILY BEFORE BREAKFAST AND AT BEDTIME    budesonide-formoteroL (SYMBICORT) 160-4.5 mcg/actuation Inhalation oral inhaler 2 Puffs, Inhalation, 2 TIMES DAILY    diclofenac sodium (VOLTAREN) 1 % Gel Apply Topically, 4 TIMES DAILY    droNABinol (MARINOL) 2.5 mg Oral Capsule     ergocalciferol, vitamin D2, (DRISDOL) 1,250 mcg (50,000 unit) Oral Capsule     ferrous sulfate 325 mg (65 mg iron) Oral  Tablet, Delayed Release (E.C.)     folic acid (FOLVITE) 1 mg, Oral, DAILY    gabapentin (NEURONTIN) 400 mg, Oral    methotrexate 12.5 mg, Oral, EVERY 7 DAYS    naloxone (NARCAN) 4 mg per spray nasal spray     oxyCODONE-acetaminophen (PERCOCET) 10-325 mg Oral tablet 1 Tablet, Oral, EVERY 4 HOURS PRN    polycarbophil calcium (FIBERCON) 1,250 mg, Oral, 4 TIMES DAILY    promethazine (PHENERGAN) 12.5 mg Oral Tablet     zolpidem (AMBIEN) 5 mg Oral Tablet         INTERVAL SURGERIES:   Past Surgical History:   Procedure Laterality Date    CARDIAC CATHETERIZATION  07/29/2015    HX CHOLECYSTECTOMY      HX COLONOSCOPY      HX NEPHRECTOMY      HX ROTATOR CUFF REPAIR  10/18/2014    SPLENECTOMY, TOTAL          FAMILY HISTORY: Reviewed and unchanged    SOCIAL HISTORY: Reviewed and unchanged    REVIEW OF SYSTEMS  Pertinent review of systems as discussed in interval oncological history      PHYSICAL EXAMINATION  Physical Exam  Eyes:      Pupils: Pupils are equal, round, and reactive to light.   Neurological:  General: No focal deficit present.      Mental Status: She is alert and oriented to person, place, and time.   Psychiatric:         Mood and Affect: Mood normal.       ASSESSMENT & PLAN  Rebecca Wall, Rebecca Wall is pleasant 62 year old with worrisome findings on imaging for metastatic disease but no diagnosis to this point.  She appears to not have myeloma.  She will continue to have treatments with Dr. Damita Wall.  We will have her follow up in 1 month.  We will continue her pain medication.  Time with patient 20 minutes.    Rebecca Barrows, MD  08/30/2023 10:30     [id number]@

## 2023-08-31 ENCOUNTER — Encounter (INDEPENDENT_AMBULATORY_CARE_PROVIDER_SITE_OTHER): Payer: Self-pay | Admitting: HEMATOLOGY-ONCOLOGY

## 2023-09-03 ENCOUNTER — Other Ambulatory Visit (HOSPITAL_COMMUNITY): Payer: Self-pay | Admitting: FAMILY PRACTICE

## 2023-09-03 ENCOUNTER — Ambulatory Visit
Admission: RE | Admit: 2023-09-03 | Discharge: 2023-09-03 | Disposition: A | Discharge status: Home / Self Care | Payer: MEDICAID | Source: Ambulatory Visit | Attending: FAMILY PRACTICE | Admitting: FAMILY PRACTICE

## 2023-09-03 ENCOUNTER — Other Ambulatory Visit: Payer: Self-pay

## 2023-09-03 DIAGNOSIS — R059 Cough, unspecified: Secondary | ICD-10-CM

## 2023-09-03 DIAGNOSIS — R7612 Nonspecific reaction to cell mediated immunity measurement of gamma interferon antigen response without active tuberculosis: Secondary | ICD-10-CM | POA: Insufficient documentation

## 2023-09-30 ENCOUNTER — Other Ambulatory Visit: Payer: Self-pay

## 2023-09-30 ENCOUNTER — Inpatient Hospital Stay
Admission: RE | Admit: 2023-09-30 | Discharge: 2023-09-30 | Disposition: A | Discharge status: Still In Hospital | Payer: MEDICAID | Source: Ambulatory Visit | Attending: RADIATION ONCOLOGY | Admitting: RADIATION ONCOLOGY

## 2023-09-30 DIAGNOSIS — M25559 Pain in unspecified hip: Secondary | ICD-10-CM | POA: Insufficient documentation

## 2023-09-30 DIAGNOSIS — M7918 Myalgia, other site: Secondary | ICD-10-CM | POA: Insufficient documentation

## 2023-09-30 DIAGNOSIS — Z0389 Encounter for observation for other suspected diseases and conditions ruled out: Secondary | ICD-10-CM

## 2023-09-30 DIAGNOSIS — M898X9 Other specified disorders of bone, unspecified site: Secondary | ICD-10-CM | POA: Insufficient documentation

## 2023-09-30 MED ORDER — OXYCODONE-ACETAMINOPHEN 10 MG-325 MG TABLET
1.0000 | ORAL_TABLET | ORAL | 0 refills | Status: DC | PRN
Start: 2023-09-30 — End: 2023-10-27

## 2023-09-30 NOTE — Progress Notes (Signed)
Name: Rebecca Wall MRN:  [id number]   Date: 09/30/2023 Age: 62 y.o.       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  She continues to have severe bone pain and sees Dr. Damita Lack.  She has had bone marrow biopsy and multiple tests that have failed to show any evidence of cancer.  Her prior scans have showed very worrisome findings in the bones.  At this time she reports continued pain but no weight loss and good appetite.  She reports continued musculoskeletal pain that is severe.  She has no new dyspnea no new hemoptysis no difficulty with bowel troubles that is new.  That shows slightly elevated beta 2 microglobulin and slightly elevated kappa fraction but no M chains, pathologist reviewed suggested is not consistent with myeloma.  She has had chest x-ray since she was seen here last.  She reports significant pain in her hip today but no other new problems.    PAST MEDICAL HISTORY    Past Medical History:   Diagnosis Date    Coronary artery disease     Depression     Essential hypertension     Generalized anxiety disorder     HLD (hyperlipidemia)         ALLERGIES:   Allergies   Allergen Reactions    Ciprofloxacin Rash    Nsaids (Non-Steroidal Anti-Inflammatory Drug)         MEDICATIONS:   Current Outpatient Medications   Medication Instructions    amLODIPine (NORVASC) 5 mg, Oral, DAILY    aspirin (ECOTRIN) 81 mg, Oral, DAILY    atorvastatin (LIPITOR) 20 mg, Oral, DAILY    bethanechol chloride (URECHOLINE) 5 mg Oral Tablet TAKE 1 TABLET BY MOUTH TWICE DAILY BEFORE BREAKFAST AND AT BEDTIME    budesonide-formoteroL (SYMBICORT) 160-4.5 mcg/actuation Inhalation oral inhaler 2 Puffs, Inhalation, 2 TIMES DAILY    diclofenac sodium (VOLTAREN) 1 % Gel Apply Topically, 4 TIMES DAILY    droNABinol (MARINOL) 2.5 mg Oral  Capsule     ergocalciferol, vitamin D2, (DRISDOL) 1,250 mcg (50,000 unit) Oral Capsule     ferrous sulfate 325 mg (65 mg iron) Oral Tablet, Delayed Release (E.C.)     folic acid (FOLVITE) 1 mg, Oral, DAILY    gabapentin (NEURONTIN) 400 mg, Oral    methotrexate 12.5 mg, Oral, EVERY 7 DAYS    naloxone (NARCAN) 4 mg per spray nasal spray     oxyCODONE-acetaminophen (PERCOCET) 10-325 mg Oral tablet 1 Tablet, Oral, EVERY 4 HOURS PRN    polycarbophil calcium (FIBERCON) 1,250 mg, Oral, 4 TIMES DAILY    promethazine (PHENERGAN) 12.5 mg Oral Tablet     zolpidem (AMBIEN) 5 mg Oral Tablet         INTERVAL SURGERIES:   Past Surgical History:   Procedure Laterality Date    CARDIAC CATHETERIZATION  07/29/2015    HX CHOLECYSTECTOMY      HX COLONOSCOPY      HX NEPHRECTOMY      HX ROTATOR CUFF REPAIR  10/18/2014    SPLENECTOMY, TOTAL          FAMILY HISTORY: Reviewed and unchanged    SOCIAL HISTORY: Reviewed and unchanged    REVIEW OF SYSTEMS  Pertinent review of systems as discussed in interval oncological history  PHYSICAL EXAMINATION  Physical Exam  Eyes:      Pupils: Pupils are equal, round, and reactive to light.   Neurological:      General: No focal deficit present.      Mental Status: She is alert and oriented to person, place, and time.   Psychiatric:         Mood and Affect: Mood normal.       ASSESSMENT & PLAN  Rebecca Wall, Rebecca Wall is pleasant 62 year old with worrisome findings on imaging for metastatic disease but no diagnosis to this point.  She appears to not have myeloma.  She will continue to have treatments with Dr. Damita Lack.  We will have her follow up in 1 month.  We will continue her pain medication.  Time with patient 20 minutes.    Rebecca Barrows, MD  09/30/2023 09:44     VH:[id number]@

## 2023-10-27 ENCOUNTER — Inpatient Hospital Stay
Admission: RE | Admit: 2023-10-27 | Discharge: 2023-10-27 | Disposition: A | Discharge status: Still In Hospital | Payer: MEDICAID | Source: Ambulatory Visit | Attending: RADIATION ONCOLOGY | Admitting: RADIATION ONCOLOGY

## 2023-10-27 ENCOUNTER — Other Ambulatory Visit: Payer: Self-pay

## 2023-10-27 DIAGNOSIS — Z0389 Encounter for observation for other suspected diseases and conditions ruled out: Secondary | ICD-10-CM

## 2023-10-27 DIAGNOSIS — M898X9 Other specified disorders of bone, unspecified site: Secondary | ICD-10-CM | POA: Insufficient documentation

## 2023-10-27 MED ORDER — OXYCODONE-ACETAMINOPHEN 10 MG-325 MG TABLET
1.0000 | ORAL_TABLET | ORAL | 0 refills | Status: DC | PRN
Start: 2023-10-27 — End: 2023-11-26

## 2023-10-27 NOTE — Progress Notes (Signed)
Name: Rebecca Wall MRN:  [id number]   Date: 10/27/2023 Age: 62 y.o.       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  She continues to have severe bone pain and sees Dr. Damita Lack.  She has had bone marrow biopsy and multiple tests that have failed to show any evidence of cancer.  Her prior scans have showed very worrisome findings in the bones.  At this time she reports continued pain.  She has had some mild weight loss with worse pain over the last week.  She attributes this to that much colder weather.  She also reports some volatility in her breast blood pressure and she will be going to her primary care physician to address that.      PAST MEDICAL HISTORY    Past Medical History:   Diagnosis Date    Coronary artery disease     Depression     Essential hypertension     Generalized anxiety disorder     HLD (hyperlipidemia)         ALLERGIES:   Allergies   Allergen Reactions    Ciprofloxacin Rash    Nsaids (Non-Steroidal Anti-Inflammatory Drug)         MEDICATIONS:   Current Outpatient Medications   Medication Instructions    amLODIPine (NORVASC) 5 mg, Oral, DAILY    aspirin (ECOTRIN) 81 mg, Oral, DAILY    atorvastatin (LIPITOR) 20 mg, Oral, DAILY    bethanechol chloride (URECHOLINE) 5 mg Oral Tablet TAKE 1 TABLET BY MOUTH TWICE DAILY BEFORE BREAKFAST AND AT BEDTIME    budesonide-formoteroL (SYMBICORT) 160-4.5 mcg/actuation Inhalation oral inhaler 2 Puffs, Inhalation, 2 TIMES DAILY    diclofenac sodium (VOLTAREN) 1 % Gel Apply Topically, 4 TIMES DAILY    droNABinol (MARINOL) 2.5 mg Oral Capsule     ergocalciferol, vitamin D2, (DRISDOL) 1,250 mcg (50,000 unit) Oral Capsule     ferrous sulfate 325 mg (65 mg iron) Oral Tablet, Delayed Release (E.C.)     folic acid (FOLVITE) 1 mg, Oral, DAILY    gabapentin (NEURONTIN)  400 mg, Oral    methotrexate 12.5 mg, Oral, EVERY 7 DAYS    naloxone (NARCAN) 4 mg per spray nasal spray     oxyCODONE-acetaminophen (PERCOCET) 10-325 mg Oral tablet 1 Tablet, Oral, EVERY 4 HOURS PRN    polycarbophil calcium (FIBERCON) 1,250 mg, Oral, 4 TIMES DAILY    promethazine (PHENERGAN) 12.5 mg Oral Tablet     zolpidem (AMBIEN) 5 mg Oral Tablet         INTERVAL SURGERIES:   Past Surgical History:   Procedure Laterality Date    CARDIAC CATHETERIZATION  07/29/2015    HX CHOLECYSTECTOMY      HX COLONOSCOPY      HX NEPHRECTOMY      HX ROTATOR CUFF REPAIR  10/18/2014    SPLENECTOMY, TOTAL          FAMILY HISTORY: Reviewed and unchanged    SOCIAL HISTORY: Reviewed and unchanged    REVIEW OF SYSTEMS  Pertinent review of systems as discussed in interval oncological history      PHYSICAL EXAMINATION  Physical Exam  Eyes:      Pupils: Pupils are equal, round, and reactive to light.   Neurological:      General: No focal deficit  present.      Mental Status: She is alert and oriented to person, place, and time.   Psychiatric:         Mood and Affect: Mood normal.       ASSESSMENT & PLAN  Rebecca Wall, Rebecca Wall is pleasant 62 year old with worrisome findings on imaging for metastatic disease but no diagnosis to this point.  She appears to not have myeloma.  She will continue to have treatments with Dr. Damita Lack.  We will have her follow up in 1 month.  We will continue her pain medication.  Time with patient 20 minutes.    Molli Barrows, MD  10/27/2023 09:09     IH:[id number]@

## 2023-11-19 ENCOUNTER — Ambulatory Visit (HOSPITAL_COMMUNITY): Payer: Self-pay

## 2023-11-23 ENCOUNTER — Ambulatory Visit (INDEPENDENT_AMBULATORY_CARE_PROVIDER_SITE_OTHER): Payer: Self-pay | Admitting: HEMATOLOGY-ONCOLOGY

## 2023-11-23 ENCOUNTER — Ambulatory Visit (INDEPENDENT_AMBULATORY_CARE_PROVIDER_SITE_OTHER): Payer: Self-pay

## 2023-11-26 ENCOUNTER — Ambulatory Visit
Admission: RE | Admit: 2023-11-26 | Discharge: 2023-11-26 | Disposition: A | Discharge status: Still In Hospital | Payer: MEDICAID | Source: Ambulatory Visit | Attending: RADIATION ONCOLOGY | Admitting: RADIATION ONCOLOGY

## 2023-11-26 ENCOUNTER — Other Ambulatory Visit: Payer: Self-pay

## 2023-11-26 DIAGNOSIS — M898X9 Other specified disorders of bone, unspecified site: Secondary | ICD-10-CM | POA: Insufficient documentation

## 2023-11-26 DIAGNOSIS — C419 Malignant neoplasm of bone and articular cartilage, unspecified: Secondary | ICD-10-CM

## 2023-11-26 MED ORDER — OXYCODONE-ACETAMINOPHEN 10 MG-325 MG TABLET
1.0000 | ORAL_TABLET | ORAL | 0 refills | Status: DC | PRN
Start: 2023-11-26 — End: 2023-12-23

## 2023-11-26 NOTE — Progress Notes (Signed)
Name: Rebecca Wall MRN:  [id number]   Date: 11/26/2023 Age: 63 y.o.       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  She continues to have severe bone pain and sees Dr. Damita Lack.  She has had bone marrow biopsy and multiple tests that have failed to show any evidence of cancer.  Her prior scans have showed very worrisome findings in the bones.  At this time she reports continued pain.  She has had some improvement in her weight over the holiday season.  She does report continued cold weather related severe bone pain.  She is scheduled for an upcoming bone scan would by Dr. Damita Lack and will see him shortly thereafter.      PAST MEDICAL HISTORY    Past Medical History:   Diagnosis Date    Coronary artery disease     Depression     Essential hypertension     Generalized anxiety disorder     HLD (hyperlipidemia)         ALLERGIES:   Allergies   Allergen Reactions    Ciprofloxacin Rash    Nsaids (Non-Steroidal Anti-Inflammatory Drug)         MEDICATIONS:   Current Outpatient Medications   Medication Instructions    amLODIPine (NORVASC) 5 mg, Oral, DAILY    aspirin (ECOTRIN) 81 mg, Oral, DAILY    atorvastatin (LIPITOR) 20 mg, Oral, DAILY    bethanechol chloride (URECHOLINE) 5 mg Oral Tablet TAKE 1 TABLET BY MOUTH TWICE DAILY BEFORE BREAKFAST AND AT BEDTIME    budesonide-formoteroL (SYMBICORT) 160-4.5 mcg/actuation Inhalation oral inhaler 2 Puffs, Inhalation, 2 TIMES DAILY    diclofenac sodium (VOLTAREN) 1 % Gel Apply Topically, 4 TIMES DAILY    droNABinol (MARINOL) 2.5 mg Oral Capsule     ergocalciferol, vitamin D2, (DRISDOL) 1,250 mcg (50,000 unit) Oral Capsule     ferrous sulfate 325 mg (65 mg iron) Oral Tablet, Delayed Release (E.C.)     folic acid (FOLVITE) 1 mg, Oral, DAILY    gabapentin (NEURONTIN) 400 mg, Oral    methotrexate 12.5 mg, Oral,  EVERY 7 DAYS    naloxone (NARCAN) 4 mg per spray nasal spray     oxyCODONE-acetaminophen (PERCOCET) 10-325 mg Oral tablet 1 Tablet, Oral, EVERY 4 HOURS PRN    polycarbophil calcium (FIBERCON) 1,250 mg, Oral, 4 TIMES DAILY    promethazine (PHENERGAN) 12.5 mg Oral Tablet     zolpidem (AMBIEN) 5 mg Oral Tablet         INTERVAL SURGERIES:   Past Surgical History:   Procedure Laterality Date    CARDIAC CATHETERIZATION  07/29/2015    HX CHOLECYSTECTOMY      HX COLONOSCOPY      HX NEPHRECTOMY      HX ROTATOR CUFF REPAIR  10/18/2014    SPLENECTOMY, TOTAL          FAMILY HISTORY: Reviewed and unchanged    SOCIAL HISTORY: Reviewed and unchanged    REVIEW OF SYSTEMS  Pertinent review of systems as discussed in interval oncological history      PHYSICAL EXAMINATION  Physical Exam  Eyes:      Pupils: Pupils are equal, round, and reactive to light.   Neurological:      General: No focal deficit present.      Mental Status: She  is alert and oriented to person, place, and time.   Psychiatric:         Mood and Affect: Mood normal.       ASSESSMENT & PLAN  Rebecca Wall is pleasant 63 year old with worrisome findings on imaging for metastatic disease but no diagnosis to this point.  She will have a bone scan in the next few days and then follow up with Dr. Damita Lack.  We will have her follow up in 1 month.  We will continue her pain medication.  Time with patient 20 minutes.    Rebecca Barrows, MD  11/26/2023 09:45     IE:[id number]@

## 2023-11-30 ENCOUNTER — Ambulatory Visit (HOSPITAL_COMMUNITY): Payer: Self-pay

## 2023-12-14 ENCOUNTER — Ambulatory Visit (HOSPITAL_COMMUNITY): Payer: Self-pay

## 2023-12-22 ENCOUNTER — Ambulatory Visit
Admission: RE | Admit: 2023-12-22 | Discharge: 2023-12-22 | Disposition: A | Discharge status: Home / Self Care | Payer: MEDICAID | Source: Ambulatory Visit | Attending: HEMATOLOGY-ONCOLOGY | Admitting: HEMATOLOGY-ONCOLOGY

## 2023-12-22 ENCOUNTER — Other Ambulatory Visit: Payer: Self-pay

## 2023-12-22 ENCOUNTER — Ambulatory Visit (HOSPITAL_COMMUNITY)
Admission: RE | Admit: 2023-12-22 | Discharge: 2023-12-22 | Disposition: A | Discharge status: Home / Self Care | Payer: MEDICAID | Source: Ambulatory Visit | Attending: HEMATOLOGY-ONCOLOGY | Admitting: HEMATOLOGY-ONCOLOGY

## 2023-12-22 DIAGNOSIS — R937 Abnormal findings on diagnostic imaging of other parts of musculoskeletal system: Secondary | ICD-10-CM | POA: Insufficient documentation

## 2023-12-23 ENCOUNTER — Ambulatory Visit
Admission: RE | Admit: 2023-12-23 | Discharge: 2023-12-23 | Disposition: A | Discharge status: Still In Hospital | Payer: MEDICAID | Source: Ambulatory Visit | Attending: RADIATION ONCOLOGY | Admitting: RADIATION ONCOLOGY

## 2023-12-23 DIAGNOSIS — C419 Malignant neoplasm of bone and articular cartilage, unspecified: Secondary | ICD-10-CM

## 2023-12-23 DIAGNOSIS — M898X9 Other specified disorders of bone, unspecified site: Secondary | ICD-10-CM | POA: Insufficient documentation

## 2023-12-23 DIAGNOSIS — M25559 Pain in unspecified hip: Secondary | ICD-10-CM | POA: Insufficient documentation

## 2023-12-23 DIAGNOSIS — M25519 Pain in unspecified shoulder: Secondary | ICD-10-CM | POA: Insufficient documentation

## 2023-12-23 MED ORDER — OXYCODONE-ACETAMINOPHEN 10 MG-325 MG TABLET
1.0000 | ORAL_TABLET | ORAL | 0 refills | Status: DC | PRN
Start: 2023-12-23 — End: 2023-12-23

## 2023-12-23 MED ORDER — OXYCODONE-ACETAMINOPHEN 10 MG-325 MG TABLET
1.0000 | ORAL_TABLET | ORAL | 0 refills | Status: DC | PRN
Start: 2023-12-23 — End: 2024-01-21

## 2023-12-23 NOTE — Addendum Note (Signed)
Encounter addended by: Molli Barrows, MD on: 12/23/2023 12:56 PM   Actions taken: Pharmacy for encounter modified, Order list changed

## 2023-12-23 NOTE — Addendum Note (Signed)
Encounter addended by: Molli Barrows, MD on: 12/23/2023 10:15 AM   Actions taken: Actions taken from a BestPractice Advisory, Order list changed

## 2023-12-23 NOTE — Progress Notes (Signed)
Name: Rebecca Wall MRN:  [id number]   Date: 12/23/2023 Age: 63 y.o.       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  She continues to have severe bone pain and sees Dr. Damita Lack.  She has had bone marrow biopsy and multiple tests that have failed to show any evidence of cancer.  Her prior scans have showed very worrisome findings in the bones.  A she has had a repeat bone scan dated December 22, 2023.  This was reviewed and showed no changes over the last year.  Though there is still significant activity.  She continues to have severe pain worse in her shoulders and hips.    PAST MEDICAL HISTORY    Past Medical History:   Diagnosis Date    Coronary artery disease     Depression     Essential hypertension     Generalized anxiety disorder     HLD (hyperlipidemia)         ALLERGIES:   Allergies   Allergen Reactions    Ciprofloxacin Rash    Nsaids (Non-Steroidal Anti-Inflammatory Drug)         MEDICATIONS:   Current Outpatient Medications   Medication Instructions    amLODIPine (NORVASC) 5 mg, Oral, DAILY    aspirin (ECOTRIN) 81 mg, Oral, DAILY    atorvastatin (LIPITOR) 20 mg, Oral, DAILY    bethanechol chloride (URECHOLINE) 5 mg Oral Tablet TAKE 1 TABLET BY MOUTH TWICE DAILY BEFORE BREAKFAST AND AT BEDTIME    budesonide-formoteroL (SYMBICORT) 160-4.5 mcg/actuation Inhalation oral inhaler 2 Puffs, Inhalation, 2 TIMES DAILY    diclofenac sodium (VOLTAREN) 1 % Gel Apply Topically, 4 TIMES DAILY    droNABinol (MARINOL) 2.5 mg Oral Capsule     ergocalciferol, vitamin D2, (DRISDOL) 1,250 mcg (50,000 unit) Oral Capsule     ferrous sulfate 325 mg (65 mg iron) Oral Tablet, Delayed Release (E.C.)     folic acid (FOLVITE) 1 mg, Oral, DAILY    gabapentin (NEURONTIN) 400 mg, Oral    methotrexate 12.5 mg, Oral, EVERY 7 DAYS    naloxone (NARCAN) 4 mg per spray  nasal spray     polycarbophil calcium (FIBERCON) 1,250 mg, Oral, 4 TIMES DAILY    promethazine (PHENERGAN) 12.5 mg Oral Tablet     zolpidem (AMBIEN) 5 mg Oral Tablet         INTERVAL SURGERIES:   Past Surgical History:   Procedure Laterality Date    CARDIAC CATHETERIZATION  07/29/2015    HX CHOLECYSTECTOMY      HX COLONOSCOPY      HX NEPHRECTOMY      HX ROTATOR CUFF REPAIR  10/18/2014    SPLENECTOMY, TOTAL          FAMILY HISTORY: Reviewed and unchanged    SOCIAL HISTORY: Reviewed and unchanged    REVIEW OF SYSTEMS  Pertinent review of systems as discussed in interval oncological history      PHYSICAL EXAMINATION  Physical Exam  Eyes:      Pupils: Pupils are equal, round, and reactive to light.   Neurological:      General: No focal deficit present.      Mental Status: She is alert and oriented to person, place, and time.   Psychiatric:         Mood and Affect: Mood  normal.       ASSESSMENT & PLAN  Rebecca, Wall is pleasant 63 year old with worrisome findings on imaging for metastatic disease but no diagnosis to this point.  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  We will have her follow up in 1 month.  We will continue her pain medication.  Time with patient 20 minutes.    Rebecca Barrows, MD  12/23/2023 09:36     GU:[id number]@

## 2023-12-24 ENCOUNTER — Ambulatory Visit (HOSPITAL_BASED_OUTPATIENT_CLINIC_OR_DEPARTMENT_OTHER): Payer: MEDICAID | Attending: RADIATION ONCOLOGY

## 2024-01-20 ENCOUNTER — Ambulatory Visit (HOSPITAL_BASED_OUTPATIENT_CLINIC_OR_DEPARTMENT_OTHER): Payer: MEDICAID

## 2024-01-20 ENCOUNTER — Ambulatory Visit: Payer: MEDICAID | Admitting: RADIATION ONCOLOGY

## 2024-01-21 ENCOUNTER — Ambulatory Visit
Admission: RE | Admit: 2024-01-21 | Discharge: 2024-01-21 | Disposition: A | Discharge status: Still In Hospital | Payer: MEDICAID | Source: Ambulatory Visit | Attending: RADIATION ONCOLOGY | Admitting: RADIATION ONCOLOGY

## 2024-01-21 ENCOUNTER — Other Ambulatory Visit: Payer: Self-pay

## 2024-01-21 DIAGNOSIS — M898X1 Other specified disorders of bone, shoulder: Secondary | ICD-10-CM | POA: Insufficient documentation

## 2024-01-21 DIAGNOSIS — M898X8 Other specified disorders of bone, other site: Secondary | ICD-10-CM | POA: Insufficient documentation

## 2024-01-21 DIAGNOSIS — R937 Abnormal findings on diagnostic imaging of other parts of musculoskeletal system: Secondary | ICD-10-CM

## 2024-01-21 DIAGNOSIS — M898X0 Other specified disorders of bone, multiple sites: Secondary | ICD-10-CM

## 2024-01-21 MED ORDER — OXYCODONE-ACETAMINOPHEN 10 MG-325 MG TABLET
1.0000 | ORAL_TABLET | ORAL | 0 refills | Status: DC | PRN
Start: 2024-01-21 — End: 2024-02-17

## 2024-01-21 NOTE — Progress Notes (Signed)
 Name: Rebecca Wall MRN:  [id number]   Date: 01/21/2024 Age: 63 y.o.       TUMOR:  Suspected metastatic cancer    TREATMENT INTERVAL:  No prior treatment    INTERVAL ONCOLOGIC HISTORY:  Rebecca, Wall is a pleasant 63 year old who has been under workup for suspected metastatic cancer.  She continues to have severe bone pain and sees Dr. Damita Lack.  She has had bone marrow biopsy and multiple tests that have failed to show any evidence of cancer.  Her prior scans have showed very worrisome findings in the bones.  A she has had a repeat bone scan dated December 22, 2023.  This was reviewed and showed no changes over the last year.  Though there is still significant activity.  She continues to have severe pain worse in her shoulders and hips.  She is scheduled for another infusion with Dr. Damita Lack in the next 2 weeks.    PAST MEDICAL HISTORY    Past Medical History:   Diagnosis Date    Coronary artery disease     Depression     Essential hypertension     Generalized anxiety disorder     HLD (hyperlipidemia)         ALLERGIES:   Allergies   Allergen Reactions    Ciprofloxacin Rash    Nsaids (Non-Steroidal Anti-Inflammatory Drug)         MEDICATIONS:   Current Outpatient Medications   Medication Instructions    amLODIPine (NORVASC) 5 mg, Oral, DAILY    aspirin (ECOTRIN) 81 mg, Oral, DAILY    atorvastatin (LIPITOR) 20 mg, Oral, DAILY    bethanechol chloride (URECHOLINE) 5 mg Oral Tablet TAKE 1 TABLET BY MOUTH TWICE DAILY BEFORE BREAKFAST AND AT BEDTIME    budesonide-formoteroL (SYMBICORT) 160-4.5 mcg/actuation Inhalation oral inhaler 2 Puffs, Inhalation, 2 TIMES DAILY    diclofenac sodium (VOLTAREN) 1 % Gel Apply Topically, 4 TIMES DAILY    droNABinol (MARINOL) 2.5 mg Oral Capsule     ergocalciferol, vitamin D2, (DRISDOL) 1,250 mcg (50,000 unit) Oral Capsule     ferrous sulfate 325 mg (65 mg iron) Oral Tablet, Delayed Release (E.C.)     folic acid (FOLVITE) 1 mg, Oral, DAILY    gabapentin (NEURONTIN) 400 mg, Oral     methotrexate 12.5 mg, Oral, EVERY 7 DAYS    naloxone (NARCAN) 4 mg per spray nasal spray     oxyCODONE-acetaminophen (PERCOCET) 10-325 mg Oral tablet 1 Tablet, Oral, EVERY 4 HOURS PRN    polycarbophil calcium (FIBERCON) 1,250 mg, Oral, 4 TIMES DAILY    promethazine (PHENERGAN) 12.5 mg Oral Tablet     zolpidem (AMBIEN) 5 mg Oral Tablet         INTERVAL SURGERIES:   Past Surgical History:   Procedure Laterality Date    CARDIAC CATHETERIZATION  07/29/2015    HX CHOLECYSTECTOMY      HX COLONOSCOPY      HX NEPHRECTOMY      HX ROTATOR CUFF REPAIR  10/18/2014    SPLENECTOMY, TOTAL          FAMILY HISTORY: Reviewed and unchanged    SOCIAL HISTORY: Reviewed and unchanged    REVIEW OF SYSTEMS  Pertinent review of systems as discussed in interval oncological history      PHYSICAL EXAMINATION  Physical Exam  Eyes:      Pupils: Pupils are equal, round, and reactive to light.   Neurological:      General: No focal deficit present.  Mental Status: She is alert and oriented to person, place, and time.   Psychiatric:         Mood and Affect: Mood normal.       ASSESSMENT & PLAN  [name], Mullally is pleasant 63 year old with worrisome findings on imaging for metastatic disease but no diagnosis to this point.  She continues with stable pain.  We will have her follow up in 1 month.  We will continue her pain medication.  Time with patient 20 minutes.    Molli Barrows, MD  01/21/2024 09:14     ON:[id number]@

## 2024-02-04 ENCOUNTER — Encounter (INDEPENDENT_AMBULATORY_CARE_PROVIDER_SITE_OTHER): Payer: Self-pay | Admitting: HEMATOLOGY-ONCOLOGY

## 2024-02-08 ENCOUNTER — Encounter (INDEPENDENT_AMBULATORY_CARE_PROVIDER_SITE_OTHER): Payer: Self-pay | Admitting: HEMATOLOGY-ONCOLOGY

## 2024-02-09 ENCOUNTER — Other Ambulatory Visit (INDEPENDENT_AMBULATORY_CARE_PROVIDER_SITE_OTHER): Payer: Self-pay | Admitting: HEMATOLOGY-ONCOLOGY

## 2024-02-10 ENCOUNTER — Ambulatory Visit
Admission: RE | Admit: 2024-02-10 | Discharge: 2024-02-10 | Disposition: A | Discharge status: Home / Self Care | Payer: MEDICAID | Source: Ambulatory Visit | Attending: HEMATOLOGY-ONCOLOGY | Admitting: HEMATOLOGY-ONCOLOGY

## 2024-02-10 ENCOUNTER — Other Ambulatory Visit: Payer: Self-pay

## 2024-02-10 VITALS — BP 125/86 | HR 82 | Temp 97.1°F | Resp 20

## 2024-02-10 DIAGNOSIS — C799 Secondary malignant neoplasm of unspecified site: Secondary | ICD-10-CM | POA: Insufficient documentation

## 2024-02-10 DIAGNOSIS — C419 Malignant neoplasm of bone and articular cartilage, unspecified: Secondary | ICD-10-CM | POA: Insufficient documentation

## 2024-02-10 LAB — CORRECTED CALCIUM
ALBUMIN: 3.9 g/dL (ref 3.5–5.7)
CALCIUM, CORRECTED: 9.7 mg/dL (ref 8.9–10.8)
CALCIUM: 9.6 mg/dL (ref 8.6–10.3)

## 2024-02-10 LAB — ALBUMIN: ALBUMIN: 3.9 g/dL (ref 3.5–5.7)

## 2024-02-10 LAB — CALCIUM: CALCIUM: 9.6 mg/dL (ref 8.6–10.3)

## 2024-02-10 MED ORDER — DENOSUMAB 120 MG/1.7 ML (70 MG/ML) SUBCUTANEOUS SOLUTION
60.0000 mg | Freq: Once | SUBCUTANEOUS | Status: AC
Start: 2024-02-10 — End: 2024-02-10
  Administered 2024-02-10: 60 mg via SUBCUTANEOUS
  Filled 2024-02-10: qty 1.7

## 2024-02-10 NOTE — Nurses Notes (Signed)
 1226 Pt ambulatory to OP Onc unit for labs/injection. Labs drawn peripherally via hospital phlebotomist. VS obtained. Calcium 9.6.  Xgeva  60 mg given subcutaneous in R arm by Audree Bless RN. Site clear. Band aid applied. Pt tolerated without difficulty. Pt left OP Onc unit ambulatory at 1345. No adverse reaction noted. Ellyn Hack, RN

## 2024-02-15 ENCOUNTER — Telehealth (INDEPENDENT_AMBULATORY_CARE_PROVIDER_SITE_OTHER): Payer: Self-pay | Admitting: HEMATOLOGY-ONCOLOGY

## 2024-02-15 ENCOUNTER — Other Ambulatory Visit (INDEPENDENT_AMBULATORY_CARE_PROVIDER_SITE_OTHER): Payer: Self-pay | Admitting: HEMATOLOGY-ONCOLOGY

## 2024-02-15 ENCOUNTER — Encounter (INDEPENDENT_AMBULATORY_CARE_PROVIDER_SITE_OTHER): Payer: Self-pay | Admitting: HEMATOLOGY-ONCOLOGY

## 2024-02-15 NOTE — Telephone Encounter (Addendum)
 Spoke to pt confirmed r/s appt w/ Mackey 03-14-24 @ 10:30. I also confirmed infusion appt per Tammy 07-27-24 @ 12

## 2024-02-16 ENCOUNTER — Other Ambulatory Visit: Payer: Self-pay

## 2024-02-17 ENCOUNTER — Ambulatory Visit
Admission: RE | Admit: 2024-02-17 | Discharge: 2024-02-17 | Disposition: A | Discharge status: Still In Hospital | Payer: MEDICAID | Source: Ambulatory Visit | Attending: RADIATION ONCOLOGY | Admitting: RADIATION ONCOLOGY

## 2024-02-17 DIAGNOSIS — R937 Abnormal findings on diagnostic imaging of other parts of musculoskeletal system: Secondary | ICD-10-CM

## 2024-02-17 DIAGNOSIS — M898X1 Other specified disorders of bone, shoulder: Secondary | ICD-10-CM | POA: Insufficient documentation

## 2024-02-17 DIAGNOSIS — M898X0 Other specified disorders of bone, multiple sites: Secondary | ICD-10-CM

## 2024-02-17 DIAGNOSIS — M1990 Unspecified osteoarthritis, unspecified site: Secondary | ICD-10-CM | POA: Insufficient documentation

## 2024-02-17 DIAGNOSIS — M898X8 Other specified disorders of bone, other site: Secondary | ICD-10-CM | POA: Insufficient documentation

## 2024-02-17 MED ORDER — OXYCODONE-ACETAMINOPHEN 10 MG-325 MG TABLET
1.0000 | ORAL_TABLET | ORAL | 0 refills | Status: DC | PRN
Start: 2024-02-17 — End: 2024-03-16

## 2024-02-17 NOTE — Progress Notes (Signed)
 Name: Rebecca Wall MRN:  [id number]   Date: 02/17/2024 Age: 63 y.o.       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  She continues to have severe bone pain and sees Dr. Latisha Poland.  She has had bone marrow biopsy and multiple tests that have failed to show any evidence of cancer.  Her prior scans have showed very worrisome findings in the bones.  A she has had a repeat bone scan dated December 22, 2023.  This was reviewed and showed no changes over the last year.  Though there is still significant activity.  She continues to have severe pain worse in her shoulders and hips.  She is scheduled for another infusion with Dr. Latisha Poland in the next 2 weeks.  She reports that she has stopped taking methotrexate which wheeze she was prescribed for arthritis.  Since that time she has felt a little better with her aches and pains.    PAST MEDICAL HISTORY    Past Medical History:   Diagnosis Date    Coronary artery disease     Depression     Essential hypertension     Generalized anxiety disorder     HLD (hyperlipidemia)         ALLERGIES:   Allergies   Allergen Reactions    Ciprofloxacin Rash    Nsaids (Non-Steroidal Anti-Inflammatory Drug)         MEDICATIONS:   Current Outpatient Medications   Medication Instructions    amLODIPine (NORVASC) 5 mg, Daily    aspirin (ECOTRIN) 81 mg, Daily    atorvastatin (LIPITOR) 20 mg, Daily    bethanechol chloride (URECHOLINE) 5 mg Oral Tablet TAKE 1 TABLET BY MOUTH TWICE DAILY BEFORE BREAKFAST AND AT BEDTIME    budesonide-formoteroL (SYMBICORT) 160-4.5 mcg/actuation Inhalation oral inhaler 2 Puffs, 2 TIMES DAILY    diclofenac sodium (VOLTAREN) 1 % Gel 4 TIMES DAILY    droNABinol (MARINOL) 2.5 mg Oral Capsule     ergocalciferol , vitamin D2, (DRISDOL) 1,250 mcg (50,000 unit) Oral Capsule     ferrous sulfate 325 mg (65 mg  iron) Oral Tablet, Delayed Release (E.C.)     folic acid (FOLVITE) 1 mg, Daily    gabapentin (NEURONTIN) 400 mg, Oral    methotrexate 12.5 mg, Oral, EVERY 7 DAYS    naloxone (NARCAN) 4 mg per spray nasal spray     oxyCODONE -acetaminophen  (PERCOCET) 10-325 mg Oral tablet 1 Tablet, Oral, EVERY 4 HOURS PRN    polycarbophil calcium (FIBERCON) 1,250 mg, 4 TIMES DAILY    promethazine (PHENERGAN) 12.5 mg Oral Tablet     zolpidem (AMBIEN) 5 mg Oral Tablet         INTERVAL SURGERIES:   Past Surgical History:   Procedure Laterality Date    CARDIAC CATHETERIZATION  07/29/2015    HX CHOLECYSTECTOMY      HX COLONOSCOPY      HX NEPHRECTOMY      HX ROTATOR CUFF REPAIR  10/18/2014    SPLENECTOMY, TOTAL          FAMILY HISTORY: Reviewed and unchanged    SOCIAL HISTORY: Reviewed and unchanged    REVIEW OF SYSTEMS  Pertinent review of systems as discussed in interval oncological history      PHYSICAL EXAMINATION  Physical Exam  Eyes:  Pupils: Pupils are equal, round, and reactive to light.   Neurological:      General: No focal deficit present.      Mental Status: She is alert and oriented to person, place, and time.   Psychiatric:         Mood and Affect: Mood normal.       ASSESSMENT & PLAN  Rebecca, Wall is pleasant 63 year old with worrisome findings on imaging for metastatic disease but no diagnosis to this point.  She continues with stable pain.  We will have her follow up in 1 month.  We will continue her pain medication.  I have counseled her to discontinue her Lipitor as it is potential source of her aches and pains and 2 not restart her methotrexate.  Time with patient 20 minutes.    Rebecca Best, MD  02/17/2024 09:50     ZO:[id number]@

## 2024-02-21 ENCOUNTER — Ambulatory Visit (INDEPENDENT_AMBULATORY_CARE_PROVIDER_SITE_OTHER): Payer: Self-pay | Admitting: HEMATOLOGY-ONCOLOGY

## 2024-02-21 ENCOUNTER — Ambulatory Visit (INDEPENDENT_AMBULATORY_CARE_PROVIDER_SITE_OTHER): Payer: Self-pay

## 2024-03-09 ENCOUNTER — Ambulatory Visit: Payer: MEDICAID

## 2024-03-13 ENCOUNTER — Encounter (INDEPENDENT_AMBULATORY_CARE_PROVIDER_SITE_OTHER): Payer: Self-pay | Admitting: HEMATOLOGY-ONCOLOGY

## 2024-03-13 NOTE — Progress Notes (Signed)
 [facility]  HEMATOLOGY/ONCOLOGY, [facility] : [facility]  [address] [place] [address]  [phone]   Hematology/Oncology    Clinic Progress Note      Luray, Dold, 63 y.o. female  Date of Birth:  03-25-1961    Referral Physician:No ref. provider found    TELEMEDICINE DOCUMENTATION:    Patient Location:  [facility], Hem/onc outpatient  Patient/family aware of provider location:  yes  Patient/family consent for telemedicine:  yes  Examination observed and performed by:  Corwin Dings, MD      Chief Complaint:   Bone lesion    HPI:  Patient is 63 year old female with PMH of CAD, hypertension, hyperlipidemia, status post splenectomy follow-up for bone lesion  Current Treatment:    Past Treatment:    Social History/Family History:    Review of Systems:   Twelve point review of system reportedly negative.    BP 111/79 (Site: Right Arm, Patient Position: Sitting, Cuff Size: Adult)   Pulse 86   Temp 36.3 C (97.4 F) (Temporal)   Wt 53.6 kg (118 lb 3.2 oz)   SpO2 100%   BMI 2747.23 kg/m          ECOG:0-1    Physical Exam:     Nurse note noted.  Vital sign noted.  Patient is in no apparent distress  Head eyes ears: Normocephalic atraumatic.  Neck: Supple.  Lungs: Normal respiratory motion.  Heart: Unable to access.  Abdomen:There is no ascites.  Extremities: Full range of motion.  Neuro:Alert and oriented to self place and time.          Last CBC  (Last result in the past 2 years)        WBC   HGB   HCT   MCV   Platelets      08/24/23 1215 9.5   12.1   36.4   99.0   346            Last Differential  (Last result in the past 2 years)        WBC   Bands   PMNs   Lymphs   Mono   Eos   Basos        08/24/23 1215 9.5     55   40   4   1   1              Last BMP  (Last result in the past 2 years)        Na   K   Cl   CO2   BUN   Cr   Calcium   Glucose   Glucose-Fasting        02/10/24 1246             9.6           02/10/24 1246             9.6                  Last Hepatic Panel  (Last result in the past 2 years)        Albumin   Total PTN   Total Bili   Direct Bili   Ast/SGOT   Alt/SGPT   Alk Phos        02/10/24 1246 3.9                   02/10/24 1246 3.9  IMPRESSION:  1- Bone lesion; on 12/2023 bone scan with increased uptake in the LS junction and mid dorsal spine similar to 11/2022 negative for biopsy.  Started on denosumab  monthly on 02/2023 switched to denosumab  q.6 month.  -on 04/2023 PET-CT[-].  -07/2023 CT CAP with  left kidney surgically absent, spleen surgically absent, diffuse bony sclerotic metastasis.  Subcentimeter hypervascular focus in the right hepatic lobe.  -on 08/2023 pathology of right iliac crest CT-guided needle core biopsy with bone sclerosis  Other bone lesion with possible mets, correlate clinically.  2-mammogram 01/2024, colonoscopy 2023.     PLAN:  PET-CT      Joanthony Hamza, MD       Portions of the current encounter note  have been copied from  the previous encounter which have been reviewed and updated where appropriate and reflects current medical decision making for today's encounter.    This note was partially created using voice recognition software and is inherently subject to errors  including those of syntax and "sound alike" substitutions which may escape  proof  reading.  In such  instances, original meaning may be extrapolated by contextual derivation.  Please notify any unusual errors in this record at   [phone] so these may be corrected and resolved.

## 2024-03-14 ENCOUNTER — Ambulatory Visit: Payer: MEDICAID | Attending: HEMATOLOGY-ONCOLOGY

## 2024-03-14 ENCOUNTER — Encounter (INDEPENDENT_AMBULATORY_CARE_PROVIDER_SITE_OTHER): Payer: Self-pay

## 2024-03-14 ENCOUNTER — Ambulatory Visit (INDEPENDENT_AMBULATORY_CARE_PROVIDER_SITE_OTHER)
Admission: RE | Admit: 2024-03-14 | Discharge: 2024-03-14 | Disposition: A | Discharge status: Home / Self Care | Payer: MEDICAID | Source: Ambulatory Visit

## 2024-03-14 ENCOUNTER — Other Ambulatory Visit: Payer: Self-pay

## 2024-03-14 VITALS — BP 111/79 | HR 86 | Temp 97.4°F | Wt 118.2 lb

## 2024-03-14 DIAGNOSIS — C419 Malignant neoplasm of bone and articular cartilage, unspecified: Secondary | ICD-10-CM

## 2024-03-14 DIAGNOSIS — M899 Disorder of bone, unspecified: Secondary | ICD-10-CM | POA: Insufficient documentation

## 2024-03-14 DIAGNOSIS — Z905 Acquired absence of kidney: Secondary | ICD-10-CM | POA: Insufficient documentation

## 2024-03-14 LAB — COMPREHENSIVE METABOLIC PANEL, NON-FASTING
ALBUMIN/GLOBULIN RATIO: 0.8 (ref 0.8–1.4)
ALBUMIN: 3.8 g/dL (ref 3.5–5.7)
ALKALINE PHOSPHATASE: 88 U/L (ref 34–104)
ALT (SGPT): 10 U/L (ref 7–52)
ANION GAP: 5 mmol/L (ref 4–13)
AST (SGOT): 13 U/L (ref 13–39)
BILIRUBIN TOTAL: 0.3 mg/dL (ref 0.3–1.0)
BUN/CREA RATIO: 10 (ref 6–22)
BUN: 9 mg/dL (ref 7–25)
CALCIUM, CORRECTED: 9.7 mg/dL (ref 8.9–10.8)
CALCIUM: 9.5 mg/dL (ref 8.6–10.3)
CHLORIDE: 107 mmol/L (ref 98–107)
CO2 TOTAL: 29 mmol/L (ref 21–31)
CREATININE: 0.93 mg/dL (ref 0.60–1.30)
ESTIMATED GFR: 70 mL/min/{1.73_m2} (ref >59)
GLOBULIN: 4.6 — ABNORMAL HIGH (ref 2.0–3.5)
GLUCOSE: 67 mg/dL — ABNORMAL LOW (ref 74–109)
OSMOLALITY, CALCULATED: 278 mosm/kg (ref 270–290)
POTASSIUM: 3.9 mmol/L (ref 3.5–5.1)
PROTEIN TOTAL: 8.4 g/dL (ref 6.4–8.9)
SODIUM: 141 mmol/L (ref 136–145)

## 2024-03-14 LAB — CBC WITH DIFF
BASOPHIL #: 0.1 10*3/uL (ref 0.00–0.10)
BASOPHIL %: 1 % (ref 0–1)
EOSINOPHIL #: 0.1 10*3/uL (ref 0.00–0.50)
EOSINOPHIL %: 1 % (ref 1–7)
HCT: 38.5 % (ref 31.2–41.9)
HGB: 12.8 g/dL (ref 10.9–14.3)
LYMPHOCYTE #: 4 10*3/uL — ABNORMAL HIGH (ref 1.10–3.10)
LYMPHOCYTE %: 41 % (ref 16–46)
MCH: 32 pg (ref 24.7–32.8)
MCHC: 33.2 g/dL (ref 32.3–35.6)
MCV: 96.4 fL — ABNORMAL HIGH (ref 75.5–95.3)
MONOCYTE #: 0.6 10*3/uL (ref 0.20–0.90)
MONOCYTE %: 7 % (ref 4–11)
MPV: 7.2 fL — ABNORMAL LOW (ref 7.9–10.8)
NEUTROPHIL #: 5 10*3/uL (ref 1.90–8.20)
NEUTROPHIL %: 51 % (ref 43–77)
PLATELETS: 369 10*3/uL (ref 140–440)
RBC: 4 10*6/uL (ref 3.63–4.92)
RDW: 14.5 % (ref 12.3–17.7)
WBC: 9.8 10*3/uL (ref 3.8–11.8)

## 2024-03-14 NOTE — Nursing Note (Signed)
 Triaged patient and placed in room to be seen by Dr. 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 She rated her pain at 2/10 being in her back. Reviewed vitals, falls, and current medication list, these are all up to date. Dr. Evelyn Hire has ordered a repeat PET scan to be completed on 04/06/24 at 10:30 am. We will see her back in clinic in 6 weeks.

## 2024-03-16 ENCOUNTER — Ambulatory Visit
Admission: RE | Admit: 2024-03-16 | Discharge: 2024-03-16 | Disposition: A | Discharge status: Still In Hospital | Payer: MEDICAID | Source: Ambulatory Visit | Attending: RADIATION ONCOLOGY | Admitting: RADIATION ONCOLOGY

## 2024-03-16 ENCOUNTER — Other Ambulatory Visit: Payer: Self-pay

## 2024-03-16 MED ORDER — OXYCODONE-ACETAMINOPHEN 10 MG-325 MG TABLET
1.0000 | ORAL_TABLET | ORAL | 0 refills | Status: DC | PRN
Start: 2024-03-16 — End: 2024-04-20

## 2024-03-27 ENCOUNTER — Encounter (HOSPITAL_COMMUNITY): Payer: Self-pay

## 2024-04-04 ENCOUNTER — Encounter (INDEPENDENT_AMBULATORY_CARE_PROVIDER_SITE_OTHER): Payer: Self-pay

## 2024-04-04 NOTE — Nursing Note (Signed)
 Assisted Dr. Evelyn Hire to do peer to peer for auth on PET scan scheduled for 04/06/24; called [phone] and spoke with Newton Barer. Who initiated the process; Dr. Evelyn Hire was able to speak with Dr. Arlee Lace for p2p; Siegfried Dress # [id number]. Good from 5/22-7/6. This information shared with Ira Mann in Nuc Med.

## 2024-04-06 ENCOUNTER — Other Ambulatory Visit: Payer: Self-pay

## 2024-04-06 ENCOUNTER — Ambulatory Visit
Admission: RE | Admit: 2024-04-06 | Discharge: 2024-04-06 | Disposition: A | Discharge status: Home / Self Care | Payer: MEDICAID | Source: Ambulatory Visit

## 2024-04-06 ENCOUNTER — Ambulatory Visit (HOSPITAL_COMMUNITY): Payer: MEDICAID

## 2024-04-06 DIAGNOSIS — C7951 Secondary malignant neoplasm of bone: Secondary | ICD-10-CM | POA: Insufficient documentation

## 2024-04-06 DIAGNOSIS — E079 Disorder of thyroid, unspecified: Secondary | ICD-10-CM

## 2024-04-06 DIAGNOSIS — C419 Malignant neoplasm of bone and articular cartilage, unspecified: Secondary | ICD-10-CM

## 2024-04-06 DIAGNOSIS — D509 Iron deficiency anemia, unspecified: Secondary | ICD-10-CM

## 2024-04-06 DIAGNOSIS — R634 Abnormal weight loss: Secondary | ICD-10-CM

## 2024-04-06 LAB — THYROID STIMULATING HORMONE (SENSITIVE TSH): TSH: 1.979 u[IU]/mL (ref 0.450–5.330)

## 2024-04-07 LAB — TRAB (TSH RECEPTOR ANTIBODY): TSH (THYROTROPIN) RECEPTOR BINDING ANTIBODY: 1.91 IU/L — ABNORMAL HIGH (ref <=1.75)

## 2024-04-08 LAB — THYROGLOBULIN ANTIBODY: THYROGLOBULIN ANTIBODIES: <1 [IU]/mL (ref <=1)

## 2024-04-12 LAB — QUEST MISC ORDER - REFRIGERATED

## 2024-04-20 ENCOUNTER — Ambulatory Visit
Admission: RE | Admit: 2024-04-20 | Discharge: 2024-04-20 | Disposition: A | Discharge status: Still In Hospital | Payer: MEDICAID | Source: Ambulatory Visit | Attending: RADIATION ONCOLOGY | Admitting: RADIATION ONCOLOGY

## 2024-04-20 ENCOUNTER — Other Ambulatory Visit: Payer: Self-pay

## 2024-04-20 DIAGNOSIS — C799 Secondary malignant neoplasm of unspecified site: Secondary | ICD-10-CM

## 2024-04-20 DIAGNOSIS — R937 Abnormal findings on diagnostic imaging of other parts of musculoskeletal system: Secondary | ICD-10-CM | POA: Insufficient documentation

## 2024-04-20 DIAGNOSIS — M25511 Pain in right shoulder: Secondary | ICD-10-CM | POA: Insufficient documentation

## 2024-04-20 DIAGNOSIS — M25512 Pain in left shoulder: Secondary | ICD-10-CM | POA: Insufficient documentation

## 2024-04-20 DIAGNOSIS — M25551 Pain in right hip: Secondary | ICD-10-CM | POA: Insufficient documentation

## 2024-04-20 DIAGNOSIS — M25552 Pain in left hip: Secondary | ICD-10-CM | POA: Insufficient documentation

## 2024-04-20 MED ORDER — OXYCODONE-ACETAMINOPHEN 10 MG-325 MG TABLET
1.0000 | ORAL_TABLET | ORAL | 0 refills | Status: DC | PRN
Start: 2024-04-20 — End: 2024-05-25

## 2024-04-20 NOTE — Progress Notes (Signed)
 Name: Rebecca Wall MRN:  [id number]   Date: 04/20/2024 Age: 63 y.o.       TUMOR:  Suspected metastatic cancer    TREATMENT INTERVAL:  No prior treatment    INTERVAL ONCOLOGIC HISTORY:  Rebecca Wall is a pleasant 63 year old who has been under workup for suspected metastatic cancer.  She continues to have severe bone pain and sees Dr. Latisha Poland and Dr. Evelyn Hire.  She has had bone marrow biopsy and multiple tests that have failed to show any evidence of cancer.  Her prior scans have showed very worrisome findings in the bones.  She continues to have severe pain worse in her shoulders and hips.  She has had a PET-CT scan that shows no new signs of uptake.  Her previously noted bony abnormalities are stable.  She has had previous joint injections in multiple joints including her shoulders hips.  These have had no positive impact on her pain.    PAST MEDICAL HISTORY    Past Medical History:   Diagnosis Date    Coronary artery disease     Depression     Essential hypertension     Generalized anxiety disorder     HLD (hyperlipidemia)         ALLERGIES:   Allergies   Allergen Reactions    Ciprofloxacin Rash    Nsaids (Non-Steroidal Anti-Inflammatory Drug)         MEDICATIONS:   Current Outpatient Medications   Medication Instructions    amLODIPine (NORVASC) 5 mg, Daily    aspirin (ECOTRIN) 81 mg, Daily    atorvastatin (LIPITOR) 20 mg, Daily    bethanechol chloride (URECHOLINE) 5 mg Oral Tablet TAKE 1 TABLET BY MOUTH TWICE DAILY BEFORE BREAKFAST AND AT BEDTIME    budesonide-formoteroL (SYMBICORT) 160-4.5 mcg/actuation Inhalation oral inhaler 2 Puffs, 2 TIMES DAILY    diclofenac sodium (VOLTAREN) 1 % Gel 4 TIMES DAILY    droNABinol (MARINOL) 2.5 mg Oral Capsule     ergocalciferol , vitamin D2, (DRISDOL) 1,250 mcg (50,000 unit) Oral Capsule     ferrous sulfate 325 mg (65 mg iron) Oral Tablet, Delayed Release (E.C.)     folic acid (FOLVITE) 1 mg, Daily    gabapentin (NEURONTIN) 400 mg, Oral    methotrexate 12.5 mg, EVERY  7 DAYS    naloxone (NARCAN) 4 mg per spray nasal spray     polycarbophil calcium (FIBERCON) 1,250 mg, 4 TIMES DAILY    promethazine (PHENERGAN) 12.5 mg Oral Tablet     zolpidem (AMBIEN) 5 mg Oral Tablet         INTERVAL SURGERIES:   Past Surgical History:   Procedure Laterality Date    CARDIAC CATHETERIZATION  07/29/2015    HX CHOLECYSTECTOMY      HX COLONOSCOPY      HX NEPHRECTOMY      HX ROTATOR CUFF REPAIR  10/18/2014    SPLENECTOMY, TOTAL          FAMILY HISTORY: Reviewed and unchanged    SOCIAL HISTORY: Reviewed and unchanged    REVIEW OF SYSTEMS  Pertinent review of systems as discussed in interval oncological history      PHYSICAL EXAMINATION  Physical Exam  Eyes:      Pupils: Pupils are equal, round, and reactive to light.   Neurological:      General: No focal deficit present.      Mental Status: She is alert and oriented to person, place, and time.   Psychiatric:  Mood and Affect: Mood normal.       ASSESSMENT & PLAN  Rebecca Wall is pleasant 63 year old with worrisome findings on imaging for metastatic disease but no diagnosis to this point.  She continues with stable pain.  We will have her follow up in 1 month.  We will continue her pain medication.  Time with patient 20 minutes.    Rebecca Best, MD  04/20/2024 09:58     ZO:[id number]@

## 2024-04-20 NOTE — Addendum Note (Signed)
 Encounter addended by: [name], MD on: 04/20/2024 11:00 AM   Actions taken: Actions taken from an [employer] Advisory, Pharmacy for encounter modified, Order list changed

## 2024-04-23 NOTE — Progress Notes (Unsigned)
 [facility]  HEMATOLOGY/ONCOLOGY, [facility]  [address] [place] [address]  [phone]   Hematology/Oncology    Clinic Progress Note      Rebecca Wall, Rebecca Wall, 63 y.o. female  Date of Birth:  1961/04/05    Referral Physician:No ref. provider found    TELEMEDICINE DOCUMENTATION:    Patient Location:  [facility], Hem/onc outpatient  Patient/family aware of provider location:  yes  Patient/family consent for telemedicine:  yes  Examination observed and performed by:  Corwin Dings, MD      Chief Complaint:   Bone lesion flow , lymphocytosis    HPI:  Patient is 63 year old female with PMH of CAD, hypertension, hyperlipidemia, status post splenectomy follow-up for bone lesion, lymphocytosis  Current Treatment:    Past Treatment:    Social History/Family History:    Review of Systems:   Twelve point review of system reportedly negative.    BP (!) 145/84 (Site: Right Arm, Patient Position: Sitting, Cuff Size: Adult)   Pulse 79   Temp 36.1 C (96.9 F) (Oral)   Resp 18   Ht 1.626 m (5' 4)   Wt 54.7 kg (120 lb 9.6 oz)   BMI 20.70 kg/m          ECOG:1    Physical Exam:     Nurse note noted.  Vital sign noted.  Patient is in no apparent distress  Head eyes ears: Normocephalic atraumatic.  Neck: Supple.  Lungs: Normal respiratory motion.  Heart: Unable to access.  Abdomen:There is no ascites.  Extremities: Full range of motion.  Neuro:Alert and oriented to self place and time.        IMPRESSION:  1- Bone lesion; on 12/2023 bone scan with increased uptake in the LS junction and mid dorsal spine similar to 11/2022 negative for biopsy.  Started on denosumab  monthly on 02/2023 switched to denosumab  q.6 month.  -on 04/2023 PET-CT[-].  -07/2023 CT CAP with  left kidney surgically absent, spleen surgically absent, diffuse bony sclerotic metastasis.  Subcentimeter hypervascular focus in the right hepatic lobe.  -on 08/2023 pathology of right iliac crest CT-guided  needle core biopsy with bone sclerosis  Other bone lesion with possible mets, correlate clinically.  -on 04/06/2024  PET scan compared to 04/2023 with no suspicious focal hypermetabolic activity involving the neck chest abdomen or pelvis is identified compared when April 29, 2023.  There is a scattered erotic skeletal lesion which do not appear to be hypermetabolic and are similar to prior studies.  2-lymphocytosis borderline.  On 03/14/2024 WBC 9.8, HCT 38.5, PLT 369.  Was 51% polys 41% lymphocytes.  ALC 4000.  9.8, hematocrit 38.5, PLT 369.  With 51% polys, 41% lymphocytes.  ALC 4000 H.    3-mammogram 01/2024, colonoscopy 2023.     PLAN:  Flow cytometry for lymphocytosis    Disa Riedlinger, MD       Portions of the current encounter note  have been copied from  the previous encounter which have been reviewed and updated where appropriate and reflects current medical decision making for today's encounter.    This note was partially created using voice recognition software and is inherently subject to errors  including those of syntax and sound alike substitutions which may escape  proof  reading.  In such  instances, original meaning may be extrapolated by contextual derivation.  Please notify any unusual errors in this record at   [phone] so these may be corrected and resolved.

## 2024-04-25 ENCOUNTER — Encounter (INDEPENDENT_AMBULATORY_CARE_PROVIDER_SITE_OTHER): Payer: Self-pay

## 2024-04-25 ENCOUNTER — Ambulatory Visit: Payer: MEDICAID

## 2024-04-25 ENCOUNTER — Ambulatory Visit (HOSPITAL_BASED_OUTPATIENT_CLINIC_OR_DEPARTMENT_OTHER)
Admission: RE | Admit: 2024-04-25 | Discharge: 2024-04-25 | Disposition: A | Discharge status: Home / Self Care | Payer: MEDICAID | Source: Ambulatory Visit

## 2024-04-25 ENCOUNTER — Other Ambulatory Visit: Payer: Self-pay

## 2024-04-25 VITALS — BP 145/84 | HR 79 | Temp 96.9°F | Resp 18 | Ht 64.0 in | Wt 120.6 lb

## 2024-04-25 DIAGNOSIS — M899 Disorder of bone, unspecified: Secondary | ICD-10-CM | POA: Insufficient documentation

## 2024-04-25 DIAGNOSIS — D72820 Lymphocytosis (symptomatic): Secondary | ICD-10-CM | POA: Insufficient documentation

## 2024-04-25 LAB — COMPREHENSIVE METABOLIC PANEL, NON-FASTING
ALBUMIN/GLOBULIN RATIO: 0.9 (ref 0.8–1.4)
ALBUMIN: 3.9 g/dL (ref 3.5–5.7)
ALKALINE PHOSPHATASE: 70 U/L (ref 34–104)
ALT (SGPT): 7 U/L (ref 7–52)
ANION GAP: 7 mmol/L (ref 4–13)
AST (SGOT): 13 U/L (ref 13–39)
BILIRUBIN TOTAL: 0.3 mg/dL (ref 0.3–1.0)
BUN/CREA RATIO: 14 (ref 6–22)
BUN: 13 mg/dL (ref 7–25)
CALCIUM, CORRECTED: 9.3 mg/dL (ref 8.9–10.8)
CALCIUM: 9.2 mg/dL (ref 8.6–10.3)
CHLORIDE: 104 mmol/L (ref 98–107)
CO2 TOTAL: 28 mmol/L (ref 21–31)
CREATININE: 0.93 mg/dL (ref 0.60–1.30)
ESTIMATED GFR: 70 mL/min/{1.73_m2} (ref >59)
GLOBULIN: 4.3 — ABNORMAL HIGH (ref 2.0–3.5)
GLUCOSE: 116 mg/dL — ABNORMAL HIGH (ref 74–109)
OSMOLALITY, CALCULATED: 279 mosm/kg (ref 270–290)
POTASSIUM: 4.5 mmol/L (ref 3.5–5.1)
PROTEIN TOTAL: 8.2 g/dL (ref 6.4–8.9)
SODIUM: 139 mmol/L (ref 136–145)

## 2024-04-25 LAB — CBC WITH DIFF
BASOPHIL #: 0.1 10*3/uL (ref 0.00–0.10)
BASOPHIL %: 1 % (ref 0–1)
EOSINOPHIL #: 0.1 10*3/uL (ref 0.00–0.50)
EOSINOPHIL %: 1 % (ref 1–7)
HCT: 38.2 % (ref 31.2–41.9)
HGB: 13 g/dL (ref 10.9–14.3)
LYMPHOCYTE #: 3 10*3/uL (ref 1.10–3.10)
LYMPHOCYTE %: 33 % (ref 16–46)
MCH: 32.9 pg — ABNORMAL HIGH (ref 24.7–32.8)
MCHC: 34.1 g/dL (ref 32.3–35.6)
MCV: 96.4 fL — ABNORMAL HIGH (ref 75.5–95.3)
MONOCYTE #: 0.6 10*3/uL (ref 0.20–0.90)
MONOCYTE %: 6 % (ref 4–11)
MPV: 7 fL — ABNORMAL LOW (ref 7.9–10.8)
NEUTROPHIL #: 5.4 10*3/uL (ref 1.90–8.20)
NEUTROPHIL %: 59 % (ref 43–77)
PLATELETS: 352 10*3/uL (ref 140–440)
RBC: 3.96 10*6/uL (ref 3.63–4.92)
RDW: 14.9 % (ref 12.3–17.7)
WBC: 9.3 10*3/uL (ref 3.8–11.8)

## 2024-04-25 NOTE — Nursing Note (Signed)
 Pt roomed, alert and oriented x3, ambulatory, pt states she is having very good day, feels good, states pain is only 3/10 to rt hip and cervical spine to mid back, pain meds relieve for her.  No falls, has AD packet at home to review.    Dr. Evelyn Hire in to see pt via telemed, discussed recent PET scan results, he informed her all was good. He is going to do a Flow cytometry lab test d/t pt's lab levels.  Pt's questions answered.    She was taken to lab for blood draw today. To f/u in 2months.

## 2024-04-27 DIAGNOSIS — D72820 Lymphocytosis (symptomatic): Secondary | ICD-10-CM

## 2024-04-27 LAB — FLOW CYTOMETRY, PERIPHERAL BLOOD: Viability (%): 96 %

## 2024-05-25 ENCOUNTER — Ambulatory Visit
Admission: RE | Admit: 2024-05-25 | Discharge: 2024-05-25 | Disposition: A | Discharge status: Still In Hospital | Payer: MEDICAID | Source: Ambulatory Visit | Attending: RADIATION ONCOLOGY | Admitting: RADIATION ONCOLOGY

## 2024-05-25 ENCOUNTER — Other Ambulatory Visit: Payer: Self-pay

## 2024-05-25 DIAGNOSIS — M898X0 Other specified disorders of bone, multiple sites: Secondary | ICD-10-CM

## 2024-05-25 DIAGNOSIS — M898X1 Other specified disorders of bone, shoulder: Secondary | ICD-10-CM | POA: Insufficient documentation

## 2024-05-25 DIAGNOSIS — R9389 Abnormal findings on diagnostic imaging of other specified body structures: Secondary | ICD-10-CM | POA: Insufficient documentation

## 2024-05-25 DIAGNOSIS — M898X5 Other specified disorders of bone, thigh: Secondary | ICD-10-CM | POA: Insufficient documentation

## 2024-05-25 DIAGNOSIS — Z9889 Other specified postprocedural states: Secondary | ICD-10-CM | POA: Insufficient documentation

## 2024-05-25 MED ORDER — OXYCODONE-ACETAMINOPHEN 10 MG-325 MG TABLET
1.0000 | ORAL_TABLET | ORAL | 0 refills | Status: DC | PRN
Start: 2024-05-25 — End: 2024-06-28

## 2024-05-25 NOTE — Progress Notes (Signed)
 Name: Rebecca Wall:  [id number]   Date: 05/25/2024 Age: 63 y.o.       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  She continues to have severe bone pain and sees Dr. Moses and Dr. Marcey.  She has had bone marrow biopsy and multiple tests that have failed to show any evidence of cancer.  Her prior scans have showed very worrisome findings in the bones.  She continues to have severe pain worse in her shoulders and hips.  She has had a PET-CT scan that shows no new signs of uptake.  Her previously noted bony abnormalities are stable.  She has had previous joint injections in multiple joints including her shoulders hips.  These have had no positive impact on her pain.  She returns in follow up in his had a recent flow cytometry on her blood that shows no new abnormalities.  She is going to [facility] to have her thyroid  evaluated in the very near future.  She is otherwise stable with severe pain.    PAST MEDICAL HISTORY    Past Medical History:   Diagnosis Date    Coronary artery disease     Depression     Essential hypertension     Generalized anxiety disorder     HLD (hyperlipidemia)         ALLERGIES:   Allergies   Allergen Reactions    Ciprofloxacin Rash    Nsaids (Non-Steroidal Anti-Inflammatory Drug)         MEDICATIONS:   Current Outpatient Medications   Medication Instructions    amLODIPine (NORVASC) 5 mg, Daily    aspirin (ECOTRIN) 81 mg, Daily    atorvastatin (LIPITOR) 20 mg, Daily    bethanechol chloride (URECHOLINE) 5 mg Oral Tablet TAKE 1 TABLET BY MOUTH TWICE DAILY BEFORE BREAKFAST AND AT BEDTIME    budesonide-formoteroL (SYMBICORT) 160-4.5 mcg/actuation Inhalation oral inhaler 2 Puffs, 2 TIMES DAILY    calcium carbonate (CALCIUM 600) 600 mg, 2 TIMES DAILY    diclofenac sodium (VOLTAREN) 1 % Gel 4 TIMES DAILY    droNABinol  (MARINOL) 2.5 mg Oral Capsule     ergocalciferol , vitamin D2, (DRISDOL) 1,250 mcg (50,000 unit) Oral Capsule     ferrous sulfate 325 mg (65 mg iron) Oral Tablet, Delayed Release (E.C.)     folic acid (FOLVITE) 1 mg, Daily    gabapentin (NEURONTIN) 400 mg, Oral    methotrexate 12.5 mg, EVERY 7 DAYS    naloxone (NARCAN) 4 mg per spray nasal spray     oxyCODONE -acetaminophen  (PERCOCET) 10-325 mg Oral tablet 1 Tablet, Oral, EVERY 4 HOURS PRN    pantoprazole (PROTONIX) 40 mg, Daily    polycarbophil calcium (FIBERCON) 1,250 mg, 4 TIMES DAILY    promethazine (PHENERGAN) 12.5 mg Oral Tablet     zolpidem (AMBIEN) 5 mg Oral Tablet         INTERVAL SURGERIES:   Past Surgical History:   Procedure Laterality Date    CARDIAC CATHETERIZATION  07/29/2015    HX CHOLECYSTECTOMY      HX COLONOSCOPY      HX NEPHRECTOMY      HX ROTATOR CUFF REPAIR  10/18/2014    SPLENECTOMY, TOTAL          FAMILY HISTORY: Reviewed and unchanged    SOCIAL HISTORY: Reviewed and  unchanged    REVIEW OF SYSTEMS  Pertinent review of systems as discussed in interval oncological history      PHYSICAL EXAMINATION  Physical Exam  Eyes:      Pupils: Pupils are equal, round, and reactive to light.   Neurological:      General: No focal deficit present.      Mental Status: She is alert and oriented to person, place, and time.   Psychiatric:         Mood and Affect: Mood normal.       ASSESSMENT & PLAN  Rebecca, Wall is pleasant 63 year old with worrisome findings on imaging for metastatic disease but no diagnosis to this point.  She continues with stable pain.  We will have her follow up in 1 month.  We will continue her pain medication.  Time with patient 20 minutes.    Rebecca Patten, MD  05/25/2024 09:18     rr:[id number]@

## 2024-06-24 NOTE — Progress Notes (Unsigned)
 [facility]  HEMATOLOGY/ONCOLOGY, [facility]  [address] [place] [address]  [phone]   Hematology/Oncology    Clinic Progress Note      Rebecca Wall, Rebecca Wall, 63 y.o. female  Date of Birth:  Jan 03, 1961    Referral Physician:No ref. provider found    TELEMEDICINE DOCUMENTATION:    Patient Location:  [facility], Hem/onc outpatient  Patient/family aware of provider location:  yes  Patient/family consent for telemedicine:  yes  Examination observed and performed by:  Rebecca Leatherwood, MD      Chief Complaint:   Bone lesion flow , lymphocytosis    HPI:  Patient is 63 year old female with PMH of CAD, hypertension, hyperlipidemia, status post splenectomy follow-up for bone lesion, lymphocytosis  Current Treatment:    Past Treatment:    Social History/Family History:    Review of Systems:   Twelve point review of system reportedly negative.    There were no vitals taken for this visit.         ECOG:1    Physical Exam:     Nurse note noted.  Vital sign noted.  Patient is in no apparent distress  Head eyes ears: Normocephalic atraumatic.  Neck: Supple.  Lungs: Normal respiratory motion.  Heart: Unable to access.  Abdomen:There is no ascites.  Extremities: Full range of motion.  Neuro:Alert and oriented to self place and time.        IMPRESSION:  1- Bone lesion; on 12/2023 bone scan with increased uptake in the LS junction and mid dorsal spine similar to 11/2022  biopsy [-].  Started on denosumab  monthly on 02/2023 switched to denosumab  q.6 month.  -on 04/2023 PET-CT[-].  -07/2023 CT CAP with  left kidney surgically absent, spleen surgically absent, diffuse bony sclerotic metastasis.  Subcentimeter hypervascular focus in the right hepatic lobe.  -on 08/2023 pathology of right iliac crest CT-guided needle core biopsy with bone sclerosis  Other bone lesion with possible mets, correlate clinically.  -on 04/06/2024  PET scan compared to 04/2023 with no suspicious focal  hypermetabolic activity involving the neck chest abdomen or pelvis is identified  when compared   to April 29, 2023.  There is a scattered sclerotic skeletal lesion which do not appear to be hypermetabolic and are similar to prior studies.  2-lymphocytosis borderline.  On 03/14/2024 WBC 9.8, HCT 38.5, PLT 369.  Was 51% polys 41% lymphocytes.  ALC 4000.  9.8, hematocrit 38.5, PLT 369.  With 51% polys, 41% lymphocytes.  ALC 4000 H.    -on 04/2024 WBC 9, HCT 38, PLT 352 with 59% polys 33% lymphocytes.  ALC 3000.  Flow cytometry[-].  3-mammogram 01/2024, colonoscopy 2023.     PLAN:  Flow cytometry for lymphocytosis**[-].  RTC  3 mo      Rebecca Chovanec, MD       Portions of the current encounter note  have been copied from  the previous encounter which have been reviewed and updated where appropriate and reflects current medical decision making for today's encounter.    This note was partially created using voice recognition software and is inherently subject to errors  including those of syntax and sound alike substitutions which may escape  proof  reading.  In such  instances, original meaning may be extrapolated by contextual derivation.  Please notify any unusual errors in this record at   [phone] so these may be corrected and resolved.

## 2024-06-27 ENCOUNTER — Encounter (INDEPENDENT_AMBULATORY_CARE_PROVIDER_SITE_OTHER): Payer: Self-pay

## 2024-06-27 ENCOUNTER — Ambulatory Visit (INDEPENDENT_AMBULATORY_CARE_PROVIDER_SITE_OTHER)
Admission: RE | Admit: 2024-06-27 | Discharge: 2024-06-27 | Disposition: A | Discharge status: Home / Self Care | Payer: MEDICAID | Source: Ambulatory Visit

## 2024-06-27 ENCOUNTER — Other Ambulatory Visit: Payer: Self-pay

## 2024-06-27 ENCOUNTER — Ambulatory Visit: Payer: MEDICAID

## 2024-06-27 VITALS — BP 130/80 | HR 87 | Temp 96.5°F | Ht 64.0 in | Wt 126.0 lb

## 2024-06-27 DIAGNOSIS — M899 Disorder of bone, unspecified: Secondary | ICD-10-CM | POA: Insufficient documentation

## 2024-06-27 DIAGNOSIS — D72820 Lymphocytosis (symptomatic): Secondary | ICD-10-CM | POA: Insufficient documentation

## 2024-06-27 DIAGNOSIS — Z9081 Acquired absence of spleen: Secondary | ICD-10-CM | POA: Insufficient documentation

## 2024-06-27 LAB — COMPREHENSIVE METABOLIC PANEL, NON-FASTING
ALBUMIN/GLOBULIN RATIO: 1 (ref 0.8–1.4)
ALBUMIN: 3.8 g/dL (ref 3.5–5.7)
ALKALINE PHOSPHATASE: 81 U/L (ref 34–104)
ALT (SGPT): 8 U/L (ref 7–52)
ANION GAP: 6 mmol/L (ref 4–13)
AST (SGOT): 13 U/L (ref 13–39)
BILIRUBIN TOTAL: 0.2 mg/dL — ABNORMAL LOW (ref 0.3–1.0)
BUN/CREA RATIO: 11 (ref 6–22)
BUN: 14 mg/dL (ref 7–25)
CALCIUM, CORRECTED: 9.7 mg/dL (ref 8.9–10.8)
CALCIUM: 9.5 mg/dL (ref 8.6–10.3)
CHLORIDE: 102 mmol/L (ref 98–107)
CO2 TOTAL: 29 mmol/L (ref 21–31)
CREATININE: 1.23 mg/dL (ref 0.60–1.30)
ESTIMATED GFR: 50 mL/min/1.73mˆ2 — ABNORMAL LOW (ref >59)
GLOBULIN: 4 — ABNORMAL HIGH (ref 2.0–3.5)
GLUCOSE: 96 mg/dL (ref 74–109)
OSMOLALITY, CALCULATED: 274 mosm/kg (ref 270–290)
POTASSIUM: 4.4 mmol/L (ref 3.5–5.1)
PROTEIN TOTAL: 7.8 g/dL (ref 6.4–8.9)
SODIUM: 137 mmol/L (ref 136–145)

## 2024-06-27 LAB — CBC WITH DIFF
BASOPHIL #: 0.1 x10ˆ3/uL (ref 0.00–0.10)
BASOPHIL %: 1 % (ref 0–1)
EOSINOPHIL #: 0.2 x10ˆ3/uL (ref 0.00–0.50)
EOSINOPHIL %: 2 % (ref 1–7)
HCT: 33.1 % (ref 31.2–41.9)
HGB: 11.3 g/dL (ref 10.9–14.3)
LYMPHOCYTE #: 5.4 x10ˆ3/uL — ABNORMAL HIGH (ref 1.10–3.10)
LYMPHOCYTE %: 47 % — ABNORMAL HIGH (ref 16–46)
MCH: 32.2 pg (ref 24.7–32.8)
MCHC: 34.2 g/dL (ref 32.3–35.6)
MCV: 94 fL (ref 75.5–95.3)
MONOCYTE #: 0.9 x10ˆ3/uL (ref 0.20–0.90)
MONOCYTE %: 8 % (ref 4–11)
MPV: 6.9 fL — ABNORMAL LOW (ref 7.9–10.8)
NEUTROPHIL #: 4.8 x10ˆ3/uL (ref 1.90–8.20)
NEUTROPHIL %: 42 % — ABNORMAL LOW (ref 43–77)
PLATELETS: 312 x10ˆ3/uL (ref 140–440)
RBC: 3.53 x10ˆ6/uL — ABNORMAL LOW (ref 3.63–4.92)
RDW: 14.5 % (ref 12.3–17.7)
WBC: 11.3 x10ˆ3/uL (ref 3.8–11.8)

## 2024-06-27 NOTE — Nursing Note (Signed)
 Triaged patient and placed in room to be seen by Dr. Marcey due to bone mass. I reviewed her vitals, fall status, and also all current medications that she is taking at this time. She rates her pain at a 7/10 being in her lower back and neck. Dr. 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 He ordered a cbc, and cmp and we will see her back in the clinic in 3 months.

## 2024-06-28 ENCOUNTER — Ambulatory Visit
Admission: RE | Admit: 2024-06-28 | Discharge: 2024-06-28 | Disposition: A | Discharge status: Still In Hospital | Payer: MEDICAID | Source: Ambulatory Visit | Attending: RADIATION ONCOLOGY | Admitting: RADIATION ONCOLOGY

## 2024-06-28 DIAGNOSIS — M25511 Pain in right shoulder: Secondary | ICD-10-CM | POA: Insufficient documentation

## 2024-06-28 DIAGNOSIS — M25551 Pain in right hip: Secondary | ICD-10-CM | POA: Insufficient documentation

## 2024-06-28 DIAGNOSIS — M25512 Pain in left shoulder: Secondary | ICD-10-CM | POA: Insufficient documentation

## 2024-06-28 DIAGNOSIS — M25552 Pain in left hip: Secondary | ICD-10-CM | POA: Insufficient documentation

## 2024-06-28 DIAGNOSIS — M898X0 Other specified disorders of bone, multiple sites: Secondary | ICD-10-CM

## 2024-06-28 DIAGNOSIS — M898X9 Other specified disorders of bone, unspecified site: Secondary | ICD-10-CM | POA: Insufficient documentation

## 2024-06-28 DIAGNOSIS — Z9889 Other specified postprocedural states: Secondary | ICD-10-CM | POA: Insufficient documentation

## 2024-06-28 DIAGNOSIS — R9389 Abnormal findings on diagnostic imaging of other specified body structures: Secondary | ICD-10-CM | POA: Insufficient documentation

## 2024-06-28 MED ORDER — OXYCODONE-ACETAMINOPHEN 10 MG-325 MG TABLET
1.0000 | ORAL_TABLET | ORAL | 0 refills | Status: DC | PRN
Start: 2024-06-28 — End: 2024-08-01

## 2024-06-28 NOTE — Progress Notes (Signed)
 Name: Rebecca Wall MRN:  [id number]   Date: 06/28/2024 Age: 63 y.o.       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  She continues to have severe bone pain and sees Dr. Moses and Dr. Marcey.  She has had bone marrow biopsy and multiple tests that have failed to show any evidence of cancer.  Her prior scans have showed very worrisome findings in the bones.  She continues to have severe pain worse in her shoulders and hips.  She has had a PET-CT scan that shows no new signs of uptake.  Her previously noted bony abnormalities are stable.  She has had previous joint injections in multiple joints including her shoulders hips.  These have had no positive impact on her pain.  She returns in follow up in his had a recent flow cytometry on her blood that shows no new abnormalities.  Her thyroid  evaluation has shown no significant abnormalities.  She is otherwise stable with severe pain.    PAST MEDICAL HISTORY    Past Medical History:   Diagnosis Date    Coronary artery disease     Depression     Essential hypertension     Generalized anxiety disorder     HLD (hyperlipidemia)         ALLERGIES:   Allergies   Allergen Reactions    Ciprofloxacin Rash    Nsaids (Non-Steroidal Anti-Inflammatory Drug)         MEDICATIONS:   Current Outpatient Medications   Medication Instructions    amLODIPine (NORVASC) 5 mg, Daily    aspirin (ECOTRIN) 81 mg, Daily    atorvastatin (LIPITOR) 20 mg, Daily    bethanechol chloride (URECHOLINE) 5 mg Oral Tablet TAKE 1 TABLET BY MOUTH TWICE DAILY BEFORE BREAKFAST AND AT BEDTIME    budesonide-formoteroL (SYMBICORT) 160-4.5 mcg/actuation Inhalation oral inhaler 2 Puffs, 2 TIMES DAILY    calcium carbonate (CALCIUM 600) 600 mg, 2 TIMES DAILY    diclofenac sodium (VOLTAREN) 1 % Gel 4 TIMES DAILY    droNABinol (MARINOL) 2.5 mg  Oral Capsule     ergocalciferol , vitamin D2, (DRISDOL) 1,250 mcg (50,000 unit) Oral Capsule     ferrous sulfate 325 mg (65 mg iron) Oral Tablet, Delayed Release (E.C.)     folic acid (FOLVITE) 1 mg, Daily    gabapentin (NEURONTIN) 400 mg, Oral    hydrOXYzine (ATARAX) 10 mg, 3 TIMES DAILY PRN    methotrexate 12.5 mg, EVERY 7 DAYS    mirtazapine (REMERON) 15 mg, NIGHTLY    naloxone (NARCAN) 4 mg per spray nasal spray     oxyCODONE -acetaminophen  (PERCOCET) 10-325 mg Oral tablet 1 Tablet, Oral, EVERY 4 HOURS PRN    pantoprazole (PROTONIX) 40 mg, Daily    promethazine (PHENERGAN) 12.5 mg Oral Tablet     zolpidem (AMBIEN) 5 mg Oral Tablet         INTERVAL SURGERIES:   Past Surgical History:   Procedure Laterality Date    CARDIAC CATHETERIZATION  07/29/2015    HX CHOLECYSTECTOMY      HX COLONOSCOPY      HX NEPHRECTOMY      HX ROTATOR CUFF REPAIR  10/18/2014    SPLENECTOMY, TOTAL          FAMILY HISTORY: Reviewed and unchanged    SOCIAL HISTORY: Reviewed  and unchanged    REVIEW OF SYSTEMS  Pertinent review of systems as discussed in interval oncological history      PHYSICAL EXAMINATION  Physical Exam  Eyes:      Pupils: Pupils are equal, round, and reactive to light.   Neurological:      General: No focal deficit present.      Mental Status: She is alert and oriented to person, place, and time.   Psychiatric:         Mood and Affect: Mood normal.       ASSESSMENT & PLAN  Rebecca Wall, Rebecca Wall is pleasant 63 year old with worrisome findings on imaging for metastatic disease but no diagnosis to this point.  She continues with stable pain.  We will have her follow up in 1 month.  We will continue her pain medication.  Time with patient 20 minutes.    Rebecca Patten, MD  06/28/2024 09:21     rr:[id number]@

## 2024-07-04 ENCOUNTER — Emergency Department
Admission: EM | Admit: 2024-07-04 | Discharge: 2024-07-04 | Disposition: A | Discharge status: Home / Self Care | Payer: MEDICAID | Attending: Emergency Medicine | Admitting: Emergency Medicine

## 2024-07-04 ENCOUNTER — Encounter (HOSPITAL_BASED_OUTPATIENT_CLINIC_OR_DEPARTMENT_OTHER): Payer: Self-pay

## 2024-07-04 ENCOUNTER — Other Ambulatory Visit: Payer: Self-pay

## 2024-07-04 ENCOUNTER — Emergency Department (HOSPITAL_BASED_OUTPATIENT_CLINIC_OR_DEPARTMENT_OTHER): Payer: MEDICAID

## 2024-07-04 DIAGNOSIS — M1612 Unilateral primary osteoarthritis, left hip: Secondary | ICD-10-CM | POA: Insufficient documentation

## 2024-07-04 DIAGNOSIS — X500XXA Overexertion from strenuous movement or load, initial encounter: Secondary | ICD-10-CM | POA: Insufficient documentation

## 2024-07-04 DIAGNOSIS — M62838 Other muscle spasm: Secondary | ICD-10-CM | POA: Insufficient documentation

## 2024-07-04 DIAGNOSIS — S39012A Strain of muscle, fascia and tendon of lower back, initial encounter: Secondary | ICD-10-CM | POA: Insufficient documentation

## 2024-07-04 DIAGNOSIS — M47816 Spondylosis without myelopathy or radiculopathy, lumbar region: Secondary | ICD-10-CM | POA: Insufficient documentation

## 2024-07-04 DIAGNOSIS — M25552 Pain in left hip: Secondary | ICD-10-CM

## 2024-07-04 DIAGNOSIS — M51369 Other intervertebral disc degeneration, lumbar region without mention of lumbar back pain or lower extremity pain: Secondary | ICD-10-CM

## 2024-07-04 MED ORDER — CYCLOBENZAPRINE 10 MG TABLET: 10.0000 mg | ORAL_TABLET | Freq: Three times a day (TID) | ORAL | 0 refills | Status: DC | PRN

## 2024-07-04 MED ORDER — METHYLPREDNISOLONE ACETATE 80 MG/ML SUSPENSION FOR INJECTION
INTRAMUSCULAR | Status: AC
Start: 2024-07-04 — End: 2024-07-04
  Filled 2024-07-04: qty 1

## 2024-07-04 MED ORDER — KETOROLAC 10 MG TABLET: 10.0000 mg | ORAL_TABLET | Freq: Four times a day (QID) | ORAL | 0 refills | Status: DC | PRN

## 2024-07-04 MED ORDER — KETOROLAC 30 MG/ML (1 ML) INJECTION SOLUTION
INTRAMUSCULAR | Status: AC
Start: 2024-07-04 — End: 2024-07-04
  Filled 2024-07-04: qty 1

## 2024-07-04 MED ORDER — CYCLOBENZAPRINE 10 MG TABLET
ORAL_TABLET | ORAL | Status: AC
Start: 2024-07-04 — End: 2024-07-04
  Filled 2024-07-04: qty 1

## 2024-07-04 MED ORDER — KETOROLAC 30 MG/ML (1 ML) INJECTION SOLUTION
30.0000 mg | INTRAMUSCULAR | Status: AC
Start: 2024-07-04 — End: 2024-07-04
  Administered 2024-07-04: 30 mg via INTRAMUSCULAR

## 2024-07-04 MED ORDER — METHYLPREDNISOLONE ACETATE 80 MG/ML SUSPENSION FOR INJECTION
80.0000 mg | Freq: Once | INTRAMUSCULAR | Status: AC
Start: 2024-07-04 — End: 2024-07-04
  Administered 2024-07-04: 80 mg via INTRAMUSCULAR

## 2024-07-04 MED ORDER — CYCLOBENZAPRINE 10 MG TABLET
10.0000 mg | ORAL_TABLET | ORAL | Status: AC
Start: 2024-07-04 — End: 2024-07-04
  Administered 2024-07-04: 10 mg via ORAL

## 2024-07-04 NOTE — ED Provider Notes (Signed)
 [facility], [facility] - Emergency Department  ED Primary Provider Note  History of Present Illness   Chief Complaint   Patient presents with    Hip Pain     Patient states started left hip pain that woke her up last night and continues to hurt.     Rebecca Wall is a 63 y.o. female who had concerns including Hip Pain.  Arrival: The patient arrived by Car patient states carrying a large amount of bags yesterday that were heavy and when she got done she started to have hip pain especially on the left side.  She states that she has had back problems in the past but her back does not hurt presently.  She denies any incontinence of urine or stool.  No numbness or tingling in the extremity.  No one-sided weakness.  She denies any recent fall.    HPI  Review of Systems   Review of Systems   Constitutional:  Positive for activity change and appetite change. Negative for chills and fever.   HENT:  Negative for ear pain and sore throat.    Eyes:  Negative for pain and visual disturbance.   Respiratory:  Negative for cough and shortness of breath.    Cardiovascular:  Negative for chest pain and palpitations.   Gastrointestinal:  Negative for abdominal pain and vomiting.   Genitourinary:  Negative for dysuria and hematuria.   Musculoskeletal:  Positive for arthralgias, gait problem, joint swelling and myalgias. Negative for back pain.   Skin:  Negative for color change and rash.   Neurological:  Negative for seizures and syncope.   All other systems reviewed and are negative.     Historical Data   History Reviewed This Encounter:     Physical Exam   ED Triage Vitals [07/04/24 1147]   BP (Non-Invasive) (!) 147/94   Heart Rate 90   Respiratory Rate 17   Temperature 36.6 C (97.9 F)   SpO2 95 %   Weight 56.3 kg (124 lb 3.2 oz)   Height 1.625 m (5' 3.98)     Physical Exam  Vitals and nursing note reviewed.   Constitutional:       General: She is not in acute distress.     Appearance: Normal appearance.  She is well-developed and normal weight.   HENT:      Head: Normocephalic and atraumatic.      Right Ear: External ear normal.      Left Ear: External ear normal.      Nose: Nose normal.      Mouth/Throat:      Mouth: Mucous membranes are dry.   Eyes:      Extraocular Movements: Extraocular movements intact.      Conjunctiva/sclera: Conjunctivae normal.      Pupils: Pupils are equal, round, and reactive to light.   Cardiovascular:      Rate and Rhythm: Normal rate and regular rhythm.      Pulses: Normal pulses.      Heart sounds: Normal heart sounds. No murmur heard.  Pulmonary:      Effort: Pulmonary effort is normal. No respiratory distress.      Breath sounds: Normal breath sounds.   Abdominal:      General: Bowel sounds are normal.      Palpations: Abdomen is soft.      Tenderness: There is no abdominal tenderness.   Musculoskeletal:         General: Tenderness present. No swelling.  Cervical back: Normal range of motion and neck supple.      Comments: Positive tenderness over the left hip area posteriorly as well as laterally.  No crepitus or deformity.   Skin:     General: Skin is warm and dry.      Capillary Refill: Capillary refill takes less than 2 seconds.   Neurological:      General: No focal deficit present.      Mental Status: She is alert and oriented to person, place, and time.   Psychiatric:         Mood and Affect: Mood normal.         Behavior: Behavior normal.         Thought Content: Thought content normal.       Patient Data   Labs Ordered/Reviewed - No data to display  XR HIP LEFT W PELVIS 2-3 VIEWS   Final Result by Edi, Radresults In (08/19 1226)   NO ACUTE FRACTURE OR DISLOCATION.       If acute hip fracture is suspected after a fall or minor trauma and initial radiographs are negative then MRI of the pelvis and affected hip without IV contrast or CT of the pelvis and hips without IV contrast is usually appropriate as the next imaging study. (ACR Appropriateness Criteria: Acute Hip  Pain-Suspected Fracture, 2018)                Radiologist location ID: [id number]           Medical Decision Making        Medical Decision Making  Patient is 63 year old black female complaining of left hip pain after carrying a bunch of heavy bags yesterday.  She stated her hip became stiff yesterday in woke up this morning with soreness.  She denies any incontinence of urine or stool.  She denies any numbness or tingling in the extremity.  She denies any recent fall or injury.  Patient will have an x-ray of her hip.  She will then be treated for results and discharged home.  Patient will follow up with PMD in the next 2-3 days.    Amount and/or Complexity of Data Reviewed  Radiology: ordered.    Risk  Prescription drug management.             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

## 2024-07-04 NOTE — ED Nurses Note (Signed)
 Patient discharged home with family.  AVS reviewed with patient/care giver.  A written copy of the AVS and discharge instructions was given to the patient/care giver. Scripts escribed to preferred pharmacy. Questions sufficiently answered as needed.  Patient/care giver encouraged to follow up with PCP as indicated.  In the event of an emergency, patient/care giver instructed to call 911 or go to the nearest emergency room.

## 2024-07-06 ENCOUNTER — Encounter (INDEPENDENT_AMBULATORY_CARE_PROVIDER_SITE_OTHER): Payer: Self-pay | Admitting: HEMATOLOGY-ONCOLOGY

## 2024-07-12 ENCOUNTER — Encounter (INDEPENDENT_AMBULATORY_CARE_PROVIDER_SITE_OTHER): Payer: Self-pay | Admitting: HEMATOLOGY-ONCOLOGY

## 2024-07-27 ENCOUNTER — Ambulatory Visit
Admission: RE | Admit: 2024-07-27 | Discharge: 2024-07-27 | Disposition: A | Discharge status: Home / Self Care | Payer: MEDICAID | Source: Ambulatory Visit | Attending: HEMATOLOGY-ONCOLOGY | Admitting: HEMATOLOGY-ONCOLOGY

## 2024-07-27 ENCOUNTER — Other Ambulatory Visit: Payer: Self-pay

## 2024-07-27 VITALS — BP 130/97 | HR 84 | Temp 96.7°F | Resp 18

## 2024-07-27 DIAGNOSIS — C419 Malignant neoplasm of bone and articular cartilage, unspecified: Secondary | ICD-10-CM | POA: Insufficient documentation

## 2024-07-27 LAB — CALCIUM: CALCIUM: 9.8 mg/dL (ref 8.6–10.3)

## 2024-07-27 LAB — CORRECTED CALCIUM
ALBUMIN: 4.2 g/dL (ref 3.5–5.7)
CALCIUM, CORRECTED: 9.6 mg/dL (ref 8.9–10.8)
CALCIUM: 9.8 mg/dL (ref 8.6–10.3)

## 2024-07-27 LAB — ALBUMIN: ALBUMIN: 4.2 g/dL (ref 3.5–5.7)

## 2024-07-27 MED ORDER — DENOSUMAB 120 MG/1.7 ML (70 MG/ML) SUBCUTANEOUS SOLUTION
60.0000 mg | Freq: Once | SUBCUTANEOUS | Status: AC
Start: 2024-07-27 — End: 2024-07-27
  Administered 2024-07-27: 60 mg via SUBCUTANEOUS
  Filled 2024-07-27: qty 1.7

## 2024-07-27 NOTE — Nurses Notes (Signed)
 1153 patient arrived to floor ambulatory. Patient here for labs and xgeva  injection. Leandrew Kitty, RN  1203 VSS. Labs released from tx plan. Leandrew Kitty, RN  1248 orders released to Rx at this time Leandrew Kitty, RN  1313 xgeva  injection given in left arm subq patient tolerated well. Leandrew Kitty, RN  1320 patient left ambulatory at this time Leandrew Kitty, RN

## 2024-08-01 ENCOUNTER — Ambulatory Visit
Admission: RE | Admit: 2024-08-01 | Discharge: 2024-08-01 | Disposition: A | Discharge status: Still In Hospital | Payer: MEDICAID | Source: Ambulatory Visit | Attending: RADIATION ONCOLOGY | Admitting: RADIATION ONCOLOGY

## 2024-08-01 ENCOUNTER — Other Ambulatory Visit: Payer: Self-pay

## 2024-08-01 DIAGNOSIS — M25511 Pain in right shoulder: Secondary | ICD-10-CM | POA: Insufficient documentation

## 2024-08-01 DIAGNOSIS — R937 Abnormal findings on diagnostic imaging of other parts of musculoskeletal system: Secondary | ICD-10-CM

## 2024-08-01 DIAGNOSIS — M898X9 Other specified disorders of bone, unspecified site: Secondary | ICD-10-CM | POA: Insufficient documentation

## 2024-08-01 DIAGNOSIS — M25551 Pain in right hip: Secondary | ICD-10-CM | POA: Insufficient documentation

## 2024-08-01 DIAGNOSIS — M898X0 Other specified disorders of bone, multiple sites: Secondary | ICD-10-CM

## 2024-08-01 DIAGNOSIS — M25512 Pain in left shoulder: Secondary | ICD-10-CM | POA: Insufficient documentation

## 2024-08-01 DIAGNOSIS — M25552 Pain in left hip: Secondary | ICD-10-CM | POA: Insufficient documentation

## 2024-08-01 MED ORDER — OXYCODONE-ACETAMINOPHEN 10 MG-325 MG TABLET
1.0000 | ORAL_TABLET | ORAL | 0 refills | Status: DC | PRN
Start: 2024-08-01 — End: 2024-09-06

## 2024-08-01 NOTE — Progress Notes (Signed)
 Name: Rebecca Wall MRN:  [id number]   Date: 08/01/2024 Age: 63 y.o.       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  She continues to have severe bone pain and sees Dr. Moses and Dr. Marcey.  She has had bone marrow biopsy and multiple tests that have failed to show any evidence of cancer.  Her prior scans have showed very worrisome findings in the bones.  She continues to have severe pain worse in her shoulders and hips.  She has had a PET-CT scan that shows no new signs of uptake.  Her previously noted bony abnormalities are stable.  She has had previous joint injections in multiple joints including her shoulders hips.  These have had no positive impact on her pain.  She returns in follow up in his had a recent flow cytometry on her blood that shows no new abnormalities.  Her thyroid  evaluation has shown no significant abnormalities.  She is otherwise stable with severe pain.    PAST MEDICAL HISTORY    Past Medical History:   Diagnosis Date    Coronary artery disease     Depression     Essential hypertension     Generalized anxiety disorder     HLD (hyperlipidemia)         ALLERGIES:   Allergies   Allergen Reactions    Ciprofloxacin Rash    Nsaids (Non-Steroidal Anti-Inflammatory Drug)         MEDICATIONS:   Current Outpatient Medications   Medication Instructions    amLODIPine (NORVASC) 5 mg, Daily    aspirin (ECOTRIN) 81 mg, Daily    atorvastatin (LIPITOR) 20 mg, Daily    bethanechol chloride (URECHOLINE) 5 mg Oral Tablet TAKE 1 TABLET BY MOUTH TWICE DAILY BEFORE BREAKFAST AND AT BEDTIME    budesonide-formoteroL (SYMBICORT) 160-4.5 mcg/actuation Inhalation oral inhaler 2 Puffs, 2 TIMES DAILY    busPIRone (BUSPAR) 5 mg, 2 TIMES DAILY    calcium carbonate (CALCIUM 600) 600 mg, 2 TIMES DAILY    cyclobenzaprine  (FLEXERIL ) 10 mg, Oral, 3  TIMES DAILY PRN    diclofenac sodium (VOLTAREN) 1 % Gel 4 TIMES DAILY    droNABinol (MARINOL) 2.5 mg Oral Capsule     ergocalciferol , vitamin D2, (DRISDOL) 1,250 mcg (50,000 unit) Oral Capsule     ferrous sulfate 325 mg (65 mg iron) Oral Tablet, Delayed Release (E.C.)     folic acid (FOLVITE) 1 mg, Daily    gabapentin (NEURONTIN) 400 mg, Oral    hydrOXYzine (ATARAX) 10 mg, 3 TIMES DAILY PRN    ketorolac  tromethamine  (TORADOL ) 10 mg, Oral, EVERY 6 HOURS PRN    methotrexate 12.5 mg, EVERY 7 DAYS    mirtazapine (REMERON) 15 mg, NIGHTLY    naloxone (NARCAN) 4 mg per spray nasal spray     oxyCODONE -acetaminophen  (PERCOCET) 10-325 mg Oral tablet 1 Tablet, Oral, EVERY 4 HOURS PRN    pantoprazole (PROTONIX) 40 mg, Daily    promethazine (PHENERGAN) 12.5 mg Oral Tablet     zolpidem (AMBIEN) 5 mg Oral Tablet         INTERVAL SURGERIES:   Past Surgical History:   Procedure Laterality Date    CARDIAC CATHETERIZATION  07/29/2015    HX CHOLECYSTECTOMY      HX COLONOSCOPY      HX NEPHRECTOMY  HX ROTATOR CUFF REPAIR  10/18/2014    SPLENECTOMY, TOTAL          FAMILY HISTORY: Reviewed and unchanged    SOCIAL HISTORY: Reviewed and unchanged    REVIEW OF SYSTEMS  Pertinent review of systems as discussed in interval oncological history      PHYSICAL EXAMINATION  Physical Exam  Eyes:      Pupils: Pupils are equal, round, and reactive to light.   Neurological:      General: No focal deficit present.      Mental Status: She is alert and oriented to person, place, and time.   Psychiatric:         Mood and Affect: Mood normal.       ASSESSMENT & PLAN  This patient continues to have severe pain worse at this time.  She has had severe neck issues and have been to see chiropractor.  We will have her follow up in 1 month.  We will continue her pain medication.  Time with patient 20 minutes.    Rebecca Patten, MD  08/01/2024 10:27     rr:[email or web address]@

## 2024-09-06 ENCOUNTER — Ambulatory Visit
Admission: RE | Admit: 2024-09-06 | Discharge: 2024-09-06 | Disposition: A | Discharge status: Still In Hospital | Payer: MEDICAID | Source: Ambulatory Visit | Attending: RADIATION ONCOLOGY | Admitting: RADIATION ONCOLOGY

## 2024-09-06 ENCOUNTER — Other Ambulatory Visit: Payer: Self-pay

## 2024-09-06 DIAGNOSIS — M898X0 Other specified disorders of bone, multiple sites: Secondary | ICD-10-CM

## 2024-09-06 DIAGNOSIS — M8580 Other specified disorders of bone density and structure, unspecified site: Secondary | ICD-10-CM

## 2024-09-06 MED ORDER — OXYCODONE-ACETAMINOPHEN 10 MG-325 MG TABLET
1.0000 | ORAL_TABLET | ORAL | 0 refills | Status: DC | PRN
Start: 2024-09-06 — End: 2024-10-06

## 2024-09-06 NOTE — Progress Notes (Signed)
 Name: Rebecca Wall MRN:  [id number]   Date: 09/06/2024 Age: 63 y.o.       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  Rebecca Wall continues to have severe bone pain and sees Dr. Moses and Dr. Marcey.  Rebecca Wall has had bone marrow biopsy and multiple tests that have failed to show any evidence of cancer.  Her prior scans have showed very worrisome findings in the bones.  Rebecca Wall continues to have severe pain worse in her shoulders and hips.  Rebecca Wall has had a PET-CT scan that shows no new signs of uptake.  Her previously noted bony abnormalities are stable.  Rebecca Wall has had previous joint injections in multiple joints including her shoulders hips.  These have had no positive impact on her pain.  Rebecca Wall returns in follow up in his had a recent flow cytometry on her blood that shows no new abnormalities.  Her thyroid  evaluation has shown no significant abnormalities.  Rebecca Wall is otherwise stable with severe pain.  Rebecca Wall has had a recent low-dose screening CT of the chest and we will follow up with her pulmonologist in the next few weeks.  Rebecca Wall has no respiratory complaints.    PAST MEDICAL HISTORY    Past Medical History:   Diagnosis Date    Coronary artery disease     Depression     Essential hypertension     Generalized anxiety disorder     HLD (hyperlipidemia)         ALLERGIES:   Allergies   Allergen Reactions    Ciprofloxacin Rash    Nsaids (Non-Steroidal Anti-Inflammatory Drug)         MEDICATIONS:   Current Outpatient Medications   Medication Instructions    amLODIPine (NORVASC) 5 mg, Daily    aspirin (ECOTRIN) 81 mg, Daily    atorvastatin (LIPITOR) 20 mg, Daily    bethanechol chloride (URECHOLINE) 5 mg Oral Tablet TAKE 1 TABLET BY MOUTH TWICE DAILY BEFORE BREAKFAST AND AT BEDTIME    budesonide-formoteroL (SYMBICORT) 160-4.5 mcg/actuation Inhalation oral inhaler 2  Puffs, 2 TIMES DAILY    busPIRone (BUSPAR) 5 mg, 2 TIMES DAILY    calcium carbonate (CALCIUM 600) 600 mg, 2 TIMES DAILY    cyclobenzaprine  (FLEXERIL ) 10 mg, Oral, 3 TIMES DAILY PRN    diclofenac sodium (VOLTAREN) 1 % Gel 4 TIMES DAILY    droNABinol (MARINOL) 2.5 mg Oral Capsule     ergocalciferol , vitamin D2, (DRISDOL) 1,250 mcg (50,000 unit) Oral Capsule     ferrous sulfate 325 mg (65 mg iron) Oral Tablet, Delayed Release (E.C.)     folic acid (FOLVITE) 1 mg, Daily    gabapentin (NEURONTIN) 400 mg, Oral    hydrOXYzine (ATARAX) 10 mg, 3 TIMES DAILY PRN    ketorolac  tromethamine  (TORADOL ) 10 mg, Oral, EVERY 6 HOURS PRN    methotrexate 12.5 mg, EVERY 7 DAYS    mirtazapine (REMERON) 15 mg, NIGHTLY    naloxone (NARCAN) 4 mg per spray nasal spray     oxyCODONE -acetaminophen  (PERCOCET) 10-325 mg Oral tablet 1 Tablet, Oral, EVERY 4 HOURS PRN    pantoprazole (PROTONIX) 40 mg, Daily    promethazine (PHENERGAN) 12.5 mg Oral Tablet     zolpidem (AMBIEN) 5 mg Oral Tablet         INTERVAL SURGERIES:   Past Surgical History:  Procedure Laterality Date    CARDIAC CATHETERIZATION  07/29/2015    HX CHOLECYSTECTOMY      HX COLONOSCOPY      HX NEPHRECTOMY      HX ROTATOR CUFF REPAIR  10/18/2014    SPLENECTOMY, TOTAL          FAMILY HISTORY: Reviewed and unchanged    SOCIAL HISTORY: Reviewed and unchanged    REVIEW OF SYSTEMS  Pertinent review of systems as discussed in interval oncological history      PHYSICAL EXAMINATION  Physical Exam  Eyes:      Pupils: Pupils are equal, round, and reactive to light.   Neurological:      General: No focal deficit present.      Mental Status: Rebecca Wall is alert and oriented to person, place, and time.   Psychiatric:         Mood and Affect: Mood normal.       ASSESSMENT & PLAN  This patient continues to have severe pain.  Rebecca Wall is considering surgery for her neck.  I will continue her pain medication.  We will have her follow up in 1 month.  Time with patient 20 minutes.    [name], MD  09/06/2024  09:23     rr:[email or web address]@

## 2024-09-07 ENCOUNTER — Other Ambulatory Visit (HOSPITAL_BASED_OUTPATIENT_CLINIC_OR_DEPARTMENT_OTHER): Payer: Self-pay | Admitting: RADIATION ONCOLOGY

## 2024-09-07 MED ORDER — GABAPENTIN 400 MG CAPSULE: 400.0000 mg | ORAL_CAPSULE | Freq: Three times a day (TID) | ORAL | 2 refills | Status: DC

## 2024-09-13 ENCOUNTER — Telehealth (INDEPENDENT_AMBULATORY_CARE_PROVIDER_SITE_OTHER): Payer: Self-pay

## 2024-09-19 ENCOUNTER — Ambulatory Visit (INDEPENDENT_AMBULATORY_CARE_PROVIDER_SITE_OTHER): Payer: Self-pay

## 2024-09-27 ENCOUNTER — Telehealth (INDEPENDENT_AMBULATORY_CARE_PROVIDER_SITE_OTHER): Payer: Self-pay

## 2024-09-27 NOTE — Telephone Encounter (Signed)
 3 mo; spoke w pt made aware need to r/s apt due to change in providers schedule she said she's driving she will call back to r/s anh 09/13/24; spoke w pt gave next available in person  apt due to [facility] full anh 09/27/24

## 2024-10-02 ENCOUNTER — Ambulatory Visit (INDEPENDENT_AMBULATORY_CARE_PROVIDER_SITE_OTHER): Payer: Self-pay

## 2024-10-06 ENCOUNTER — Ambulatory Visit
Admission: RE | Admit: 2024-10-06 | Discharge: 2024-10-06 | Disposition: A | Discharge status: Still In Hospital | Payer: MEDICAID | Source: Ambulatory Visit | Attending: RADIATION ONCOLOGY | Admitting: RADIATION ONCOLOGY

## 2024-10-06 ENCOUNTER — Other Ambulatory Visit: Payer: Self-pay

## 2024-10-06 ENCOUNTER — Ambulatory Visit: Payer: MEDICAID | Attending: FAMILY PRACTICE

## 2024-10-06 DIAGNOSIS — Z9889 Other specified postprocedural states: Secondary | ICD-10-CM | POA: Insufficient documentation

## 2024-10-06 DIAGNOSIS — D649 Anemia, unspecified: Secondary | ICD-10-CM | POA: Insufficient documentation

## 2024-10-06 DIAGNOSIS — E559 Vitamin D deficiency, unspecified: Secondary | ICD-10-CM | POA: Insufficient documentation

## 2024-10-06 DIAGNOSIS — Z79899 Other long term (current) drug therapy: Secondary | ICD-10-CM | POA: Insufficient documentation

## 2024-10-06 DIAGNOSIS — M5489 Other dorsalgia: Secondary | ICD-10-CM | POA: Insufficient documentation

## 2024-10-06 DIAGNOSIS — I10 Essential (primary) hypertension: Secondary | ICD-10-CM | POA: Insufficient documentation

## 2024-10-06 DIAGNOSIS — M25552 Pain in left hip: Secondary | ICD-10-CM | POA: Insufficient documentation

## 2024-10-06 DIAGNOSIS — M25512 Pain in left shoulder: Secondary | ICD-10-CM | POA: Insufficient documentation

## 2024-10-06 DIAGNOSIS — M899 Disorder of bone, unspecified: Secondary | ICD-10-CM

## 2024-10-06 DIAGNOSIS — M545 Low back pain, unspecified: Secondary | ICD-10-CM | POA: Insufficient documentation

## 2024-10-06 DIAGNOSIS — M898X9 Other specified disorders of bone, unspecified site: Secondary | ICD-10-CM | POA: Insufficient documentation

## 2024-10-06 DIAGNOSIS — M25551 Pain in right hip: Secondary | ICD-10-CM | POA: Insufficient documentation

## 2024-10-06 DIAGNOSIS — M25511 Pain in right shoulder: Secondary | ICD-10-CM | POA: Insufficient documentation

## 2024-10-06 DIAGNOSIS — M1990 Unspecified osteoarthritis, unspecified site: Secondary | ICD-10-CM | POA: Insufficient documentation

## 2024-10-06 DIAGNOSIS — E785 Hyperlipidemia, unspecified: Secondary | ICD-10-CM | POA: Insufficient documentation

## 2024-10-06 DIAGNOSIS — E538 Deficiency of other specified B group vitamins: Secondary | ICD-10-CM | POA: Insufficient documentation

## 2024-10-06 LAB — URINALYSIS, MACROSCOPIC
BILIRUBIN: NEGATIVE mg/dL
GLUCOSE: NEGATIVE mg/dL
KETONES: NEGATIVE mg/dL
NITRITE: NEGATIVE
PH: 6 (ref 4.6–8.0)
PROTEIN: NEGATIVE mg/dL
SPECIFIC GRAVITY: 1.015 (ref 1.003–1.035)
UROBILINOGEN: 0.2 mg/dL (ref 0.2–1.0)

## 2024-10-06 LAB — THYROID STIMULATING HORMONE (SENSITIVE TSH): TSH: 0.493 u[IU]/mL (ref 0.358–3.740)

## 2024-10-06 LAB — LIPID PANEL
CHOL/HDL RATIO: 3.6
CHOLESTEROL: 164 mg/dL (ref <200)
HDL CHOL: 45 mg/dL (ref >=40)
LDL CALC: 83 mg/dL (ref 0–100)
TRIGLYCERIDES: 180 mg/dL — ABNORMAL HIGH (ref <=150)
VLDL CALC: 36 mg/dL (ref 0–50)

## 2024-10-06 LAB — CBC WITH DIFF
BASOPHIL #: 0.06 x10ˆ3/uL (ref 0.00–0.10)
BASOPHIL %: 1 % (ref 0–1)
EOSINOPHIL #: 0.15 x10ˆ3/uL (ref 0.00–0.50)
EOSINOPHIL %: 1 % (ref 1–7)
HCT: 36.7 % (ref 31.2–41.9)
HGB: 11.9 g/dL (ref 10.9–14.3)
LYMPHOCYTE #: 4.71 x10ˆ3/uL — ABNORMAL HIGH (ref 1.10–3.10)
LYMPHOCYTE %: 42 % (ref 16–46)
MCH: 32.1 pg (ref 24.7–32.8)
MCHC: 32.4 g/dL (ref 32.3–35.6)
MCV: 99.3 fL — ABNORMAL HIGH (ref 75.5–95.3)
MONOCYTE #: 0.94 x10ˆ3/uL — ABNORMAL HIGH (ref 0.20–0.90)
MONOCYTE %: 8 % (ref 4–11)
MPV: 7.1 fL — ABNORMAL LOW (ref 7.9–10.8)
NEUTROPHIL #: 5.35 x10ˆ3/uL (ref 1.90–8.20)
NEUTROPHIL %: 48 % (ref 43–77)
PLATELETS: 359 x10ˆ3/uL (ref 140–440)
RBC: 3.7 x10ˆ6/uL (ref 3.63–4.92)
RDW: 16.6 % (ref 12.3–17.7)
WBC: 11.2 x10ˆ3/uL (ref 3.8–11.8)

## 2024-10-06 LAB — COMPREHENSIVE METABOLIC PANEL, NON-FASTING
ALBUMIN/GLOBULIN RATIO: 0.7 — ABNORMAL LOW (ref 0.8–1.4)
ALBUMIN: 3.2 g/dL — ABNORMAL LOW (ref 3.4–5.0)
ALKALINE PHOSPHATASE: 102 U/L (ref 46–116)
ALT (SGPT): 15 U/L (ref <=78)
ANION GAP: 7 mmol/L (ref 4–13)
AST (SGOT): 13 U/L — ABNORMAL LOW (ref 15–37)
BILIRUBIN TOTAL: 0.2 mg/dL (ref 0.2–1.0)
BUN/CREA RATIO: 11
BUN: 12 mg/dL (ref 7–18)
CALCIUM, CORRECTED: 9.4 mg/dL
CALCIUM: 8.8 mg/dL (ref 8.5–10.1)
CHLORIDE: 106 mmol/L (ref 98–107)
CO2 TOTAL: 29 mmol/L (ref 21–32)
CREATININE: 1.09 mg/dL — ABNORMAL HIGH (ref 0.55–1.02)
ESTIMATED GFR: 57 mL/min/1.73mˆ2 — ABNORMAL LOW (ref >59)
GLOBULIN: 4.9
GLUCOSE: 96 mg/dL (ref 74–106)
OSMOLALITY, CALCULATED: 283 mosm/kg (ref 270–290)
POTASSIUM: 4.6 mmol/L (ref 3.5–5.1)
PROTEIN TOTAL: 8.1 g/dL (ref 6.4–8.2)
SODIUM: 142 mmol/L (ref 136–145)

## 2024-10-06 LAB — URINALYSIS, MICROSCOPIC

## 2024-10-06 LAB — VITAMIN B12: VITAMIN B 12: >1500 pg/mL — ABNORMAL HIGH (ref 180–914)

## 2024-10-06 LAB — IRON TRANSFERRIN AND TIBC
IRON (TRANSFERRIN) SATURATION: 13 % — ABNORMAL LOW (ref 15–50)
IRON: 42 ug/dL — ABNORMAL LOW (ref 50–212)
TOTAL IRON BINDING CAPACITY: 319 ug/dL (ref 250–450)
TRANSFERRIN: 228 mg/dL (ref 203–362)
UIBC: 277 ug/dL (ref 130–375)

## 2024-10-06 LAB — MICROALBUMIN/CREATININE RATIO, URINE, RANDOM
CREATININE RANDOM URINE: 97 mg/dL (ref 30–125)
MICROALBUMIN RANDOM URINE: 1.8 mg/dL
MICROALBUMIN/CREATININE RATIO RANDOM URINE: 18.6 mg/g

## 2024-10-06 LAB — FOLATE: FOLATE: 18.6 ng/mL (ref 5.9–24.8)

## 2024-10-06 LAB — HGA1C (HEMOGLOBIN A1C WITH EST AVG GLUCOSE): HEMOGLOBIN A1C: 5.7 % (ref 4.0–6.0)

## 2024-10-06 LAB — VITAMIN D 25 TOTAL: VITAMIN D 25, TOTAL: 44.53 ng/mL (ref 30.00–100.00)

## 2024-10-06 MED ORDER — OXYCODONE-ACETAMINOPHEN 10 MG-325 MG TABLET: 1.0000 | ORAL_TABLET | ORAL | 0 refills | Status: DC | PRN

## 2024-10-06 NOTE — Progress Notes (Signed)
 Name: KINZA GOUVEIA MRN:  [id number]   Date: 10/06/2024 Age: 63 y.o.       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  She continues to have severe bone pain and sees Dr. Moses and Dr. Marcey.  She has had bone marrow biopsy and multiple tests that have failed to show any evidence of cancer.  Her prior scans have showed very worrisome findings in the bones.  She continues to have severe pain worse in her shoulders and hips.  She has had a PET-CT scan that shows no new signs of uptake.  Her previously noted bony abnormalities are stable.  She has had previous joint injections in multiple joints including her shoulders hips.  These have had no positive impact on her pain.  She returns in follow up with stable complaints of low back pain in upper back pain.    PAST MEDICAL HISTORY    Past Medical History:   Diagnosis Date    Coronary artery disease     Depression     Essential hypertension     Generalized anxiety disorder     HLD (hyperlipidemia)         ALLERGIES:   Allergies   Allergen Reactions    Ciprofloxacin Rash    Nsaids (Non-Steroidal Anti-Inflammatory Drug)         MEDICATIONS:   Current Outpatient Medications   Medication Instructions    amLODIPine (NORVASC) 5 mg, Daily    aspirin (ECOTRIN) 81 mg, Daily    atorvastatin (LIPITOR) 20 mg, Daily    bethanechol chloride (URECHOLINE) 5 mg Oral Tablet TAKE 1 TABLET BY MOUTH TWICE DAILY BEFORE BREAKFAST AND AT BEDTIME    budesonide-formoteroL (SYMBICORT) 160-4.5 mcg/actuation Inhalation oral inhaler 2 Puffs, 2 TIMES DAILY    busPIRone (BUSPAR) 5 mg, 2 TIMES DAILY    calcium carbonate (CALCIUM 600) 600 mg, 2 TIMES DAILY    cyclobenzaprine  (FLEXERIL ) 10 mg, Oral, 3 TIMES DAILY PRN    diclofenac sodium (VOLTAREN) 1 % Gel 4 TIMES DAILY    droNABinol (MARINOL) 2.5 mg Oral Capsule      ergocalciferol , vitamin D2, (DRISDOL) 1,250 mcg (50,000 unit) Oral Capsule     ferrous sulfate 325 mg (65 mg iron) Oral Tablet, Delayed Release (E.C.)     folic acid  (FOLVITE ) 1 mg, Daily    gabapentin  (NEURONTIN ) 400 mg, Oral, 3 TIMES DAILY    hydrOXYzine (ATARAX) 10 mg, 3 TIMES DAILY PRN    ketorolac  tromethamine  (TORADOL ) 10 mg, Oral, EVERY 6 HOURS PRN    methotrexate 12.5 mg, EVERY 7 DAYS    mirtazapine (REMERON) 15 mg, NIGHTLY    naloxone (NARCAN) 4 mg per spray nasal spray     oxyCODONE -acetaminophen  (PERCOCET) 10-325 mg Oral tablet 1 Tablet, Oral, EVERY 4 HOURS PRN    pantoprazole (PROTONIX) 40 mg, Daily    promethazine (PHENERGAN) 12.5 mg Oral Tablet     zolpidem (AMBIEN) 5 mg Oral Tablet         INTERVAL SURGERIES:   Past Surgical History:   Procedure Laterality Date    CARDIAC CATHETERIZATION  07/29/2015    HX CHOLECYSTECTOMY      HX COLONOSCOPY      HX NEPHRECTOMY      HX ROTATOR CUFF REPAIR  10/18/2014    SPLENECTOMY, TOTAL  FAMILY HISTORY: Reviewed and unchanged    SOCIAL HISTORY: Reviewed and unchanged    REVIEW OF SYSTEMS  Pertinent review of systems as discussed in interval oncological history      PHYSICAL EXAMINATION  Physical Exam  Eyes:      Pupils: Pupils are equal, round, and reactive to light.   Neurological:      General: No focal deficit present.      Mental Status: She is alert and oriented to person, place, and time.   Psychiatric:         Mood and Affect: Mood normal.       ASSESSMENT & PLAN  This patient continues to have severe pain.  I have discussed the potential for radiation for her arthritis.  I will continue her pain medication.  We will have her follow up in 1 month.  Time with patient 20 minutes.    [name], MD  10/06/2024 09:35     rr:[email or web address]

## 2024-11-01 ENCOUNTER — Ambulatory Visit (INDEPENDENT_AMBULATORY_CARE_PROVIDER_SITE_OTHER): Admission: RE | Admit: 2024-11-01 | Discharge: 2024-11-01 | Disposition: A | Payer: MEDICAID | Source: Ambulatory Visit

## 2024-11-01 ENCOUNTER — Other Ambulatory Visit (INDEPENDENT_AMBULATORY_CARE_PROVIDER_SITE_OTHER): Payer: Self-pay | Admitting: NURSE PRACTITIONER

## 2024-11-01 ENCOUNTER — Other Ambulatory Visit: Payer: Self-pay

## 2024-11-01 ENCOUNTER — Ambulatory Visit: Payer: MEDICAID | Admitting: NURSE PRACTITIONER

## 2024-11-01 ENCOUNTER — Encounter (INDEPENDENT_AMBULATORY_CARE_PROVIDER_SITE_OTHER): Payer: Self-pay | Admitting: NURSE PRACTITIONER

## 2024-11-01 VITALS — BP 116/72 | HR 82 | Temp 96.3°F | Ht 64.0 in | Wt 127.0 lb

## 2024-11-01 DIAGNOSIS — R937 Abnormal findings on diagnostic imaging of other parts of musculoskeletal system: Secondary | ICD-10-CM

## 2024-11-01 DIAGNOSIS — D72820 Lymphocytosis (symptomatic): Secondary | ICD-10-CM

## 2024-11-01 DIAGNOSIS — F17210 Nicotine dependence, cigarettes, uncomplicated: Secondary | ICD-10-CM | POA: Insufficient documentation

## 2024-11-01 DIAGNOSIS — M5489 Other dorsalgia: Secondary | ICD-10-CM

## 2024-11-01 DIAGNOSIS — R269 Unspecified abnormalities of gait and mobility: Secondary | ICD-10-CM | POA: Insufficient documentation

## 2024-11-01 DIAGNOSIS — Z716 Tobacco abuse counseling: Secondary | ICD-10-CM | POA: Insufficient documentation

## 2024-11-01 DIAGNOSIS — Z9889 Other specified postprocedural states: Secondary | ICD-10-CM | POA: Insufficient documentation

## 2024-11-01 DIAGNOSIS — M1990 Unspecified osteoarthritis, unspecified site: Secondary | ICD-10-CM | POA: Insufficient documentation

## 2024-11-01 DIAGNOSIS — M810 Age-related osteoporosis without current pathological fracture: Secondary | ICD-10-CM | POA: Insufficient documentation

## 2024-11-01 DIAGNOSIS — Z13820 Encounter for screening for osteoporosis: Secondary | ICD-10-CM

## 2024-11-01 LAB — COMPREHENSIVE METABOLIC PANEL, NON-FASTING
ALBUMIN/GLOBULIN RATIO: 1 (ref 0.8–1.4)
ALBUMIN: 4.3 g/dL (ref 3.5–5.7)
ALKALINE PHOSPHATASE: 84 U/L (ref 34–104)
ALT (SGPT): 8 U/L (ref 7–52)
ANION GAP: 7 mmol/L (ref 4–13)
AST (SGOT): 13 U/L (ref 13–39)
BILIRUBIN TOTAL: 0.5 mg/dL (ref 0.3–1.0)
BUN/CREA RATIO: 11 (ref 6–22)
BUN: 11 mg/dL (ref 7–25)
CALCIUM, CORRECTED: 9.5 mg/dL (ref 8.9–10.8)
CALCIUM: 9.7 mg/dL (ref 8.6–10.3)
CHLORIDE: 104 mmol/L (ref 98–107)
CO2 TOTAL: 28 mmol/L (ref 21–31)
CREATININE: 0.99 mg/dL (ref 0.60–1.30)
ESTIMATED GFR: 64 mL/min/1.73mˆ2 (ref >59)
GLOBULIN: 4.5 — ABNORMAL HIGH (ref 2.0–3.5)
GLUCOSE: 91 mg/dL (ref 74–109)
OSMOLALITY, CALCULATED: 277 mosm/kg (ref 270–290)
POTASSIUM: 4.4 mmol/L (ref 3.5–5.1)
PROTEIN TOTAL: 8.8 g/dL (ref 6.4–8.9)
SODIUM: 139 mmol/L (ref 136–145)

## 2024-11-01 LAB — CBC WITH DIFF
BASOPHIL #: 0.1 x10ˆ3/uL (ref 0.00–0.10)
BASOPHIL %: 2 % — ABNORMAL HIGH (ref 0–1)
EOSINOPHIL #: 0 x10ˆ3/uL (ref 0.00–0.50)
EOSINOPHIL %: 1 % (ref 1–7)
HCT: 37.3 % (ref 31.2–41.9)
HGB: 12.7 g/dL (ref 10.9–14.3)
LYMPHOCYTE #: 3.9 x10ˆ3/uL — ABNORMAL HIGH (ref 1.10–3.10)
LYMPHOCYTE %: 45 % (ref 16–46)
MCH: 32.3 pg (ref 24.7–32.8)
MCHC: 33.9 g/dL (ref 32.3–35.6)
MCV: 95.3 fL (ref 75.5–95.3)
MONOCYTE #: 0.5 x10ˆ3/uL (ref 0.20–0.90)
MONOCYTE %: 6 % (ref 4–11)
MPV: 6.8 fL — ABNORMAL LOW (ref 7.9–10.8)
NEUTROPHIL #: 4.1 x10ˆ3/uL (ref 1.90–8.20)
NEUTROPHIL %: 47 % (ref 43–77)
PLATELETS: 418 x10ˆ3/uL (ref 140–440)
RBC: 3.92 x10ˆ6/uL (ref 3.63–4.92)
RDW: 15.3 % (ref 12.3–17.7)
WBC: 8.7 x10ˆ3/uL (ref 3.8–11.8)

## 2024-11-01 NOTE — Cancer Center Note (Signed)
 Department of Hematology/Oncology  Return Patient Visit      Name: Rebecca Wall  FMW:[id number]  Date of Birth: 02/10/1961  Encounter Date: 11/01/2024    REFERRING PROVIDER:  Ladora Lines, DO  [address] [facility]  [place],  [place] [address]      REASON FOR OFFICE VISIT:  Follow Up     HISTORY OF PRESENT ILLNESS:  Rebecca Wall is a 63 y.o. female who presents today for follow up of abnormal imaging studies.  She previously had a bone scan showing multiple areas of significant sclerosis.     She was started on denosumab , and it was initially on a monthly basis and eventually changed to every 6 months.  Her last injection was 07/27/24.      She has seen Radiation Oncology and may receive radiation in the future for her arthritis.     She continues to smoke about 1/2 PPD and has been trying to cut back.  She reports that her significant other is a chain smoker and she tends to smoke more around him.  She follows with pulmonology and she believes they will be ordering a yearly CT of the chest at her next appointment.     ROS:   Review of Systems   Constitutional:  Positive for fatigue. Negative for appetite change and unexpected weight change.   HENT:   Negative for trouble swallowing.    Respiratory:  Positive for shortness of breath (with exertion). Negative for chest tightness.    Cardiovascular:  Negative for chest pain.   Gastrointestinal:  Negative for abdominal pain and blood in stool.   Genitourinary:  Negative for difficulty urinating and hematuria.    Musculoskeletal:  Positive for arthralgias and back pain.   Skin:  Negative for itching and wound.   Neurological:  Negative for dizziness, headaches and light-headedness.        HISTORY:  Past Medical History:   Diagnosis Date    Coronary artery disease     Depression     Essential hypertension     Generalized anxiety disorder     HLD (hyperlipidemia)          Past Surgical History:   Procedure Laterality Date    CARDIAC CATHETERIZATION  07/29/2015    HX  CHOLECYSTECTOMY      HX COLONOSCOPY      HX NEPHRECTOMY      HX ROTATOR CUFF REPAIR  10/18/2014    SPLENECTOMY, TOTAL           Social History     Socioeconomic History    Marital status: Divorced     Spouse name: Not on file    Number of children: Not on file    Years of education: Not on file    Highest education level: Not on file   Occupational History    Not on file   Tobacco Use    Smoking status: Every Day     Current packs/day: 0.50     Average packs/day: 0.5 packs/day for 41.7 years (20.9 ttl pk-yrs)     Types: Cigarettes     Start date: 02/11/1983    Smokeless tobacco: Never   Vaping Use    Vaping status: Never Used   Substance and Sexual Activity    Alcohol use: Not Currently    Drug use: Never    Sexual activity: Not on file   Other Topics Concern    Not on file   Social History Narrative  Not on file     Social Determinants of Health     Financial Resource Strain: Not on file   Transportation Needs: Not on file   Social Connections: Not on file   Intimate Partner Violence: Not on file   Housing Stability: Not on file     Family Medical History:       Problem Relation (Age of Onset)    Bone cancer Mother    Cerebral Aneurysm Brother    Diabetes type I Father    Heart Disease Father    Hypertension (High Blood Pressure) Mother, Father, Brother    Prostate Cancer Father            Current Outpatient Medications   Medication Sig    amLODIPine (NORVASC) 5 mg Oral Tablet Take 1 Tablet (5 mg total) by mouth Daily    aspirin (ECOTRIN) 81 mg Oral Tablet, Delayed Release (E.C.) Take 1 Tablet (81 mg total) by mouth Daily    atorvastatin (LIPITOR) 20 mg Oral Tablet Take 1 Tablet (20 mg total) by mouth Daily    budesonide-formoteroL (SYMBICORT) 160-4.5 mcg/actuation Inhalation oral inhaler Take 2 Puffs by inhalation Twice daily    calcium carbonate (CALCIUM 600) 600 mg calcium (1,500 mg) Oral Tablet Take 1 Tablet (600 mg total) by mouth Twice daily    diclofenac sodium (VOLTAREN) 1 % Gel Apply topically Four times  a day    droNABinol (MARINOL) 2.5 mg Oral Capsule     ergocalciferol , vitamin D2, (DRISDOL) 1,250 mcg (50,000 unit) Oral Capsule     ferrous sulfate 325 mg (65 mg iron) Oral Tablet, Delayed Release (E.C.)     folic acid  (FOLVITE ) 1 mg Oral Tablet Take 1 Tablet (1 mg total) by mouth Daily    hydrOXYzine (ATARAX) 10 mg/5 mL Oral Solution Take 5 mL (10 mg total) by mouth Three times a day as needed for Other    methotrexate 2.5 mg Oral tablet Take 5 Tablets (12.5 mg total) by mouth Every 7 days    naloxone (NARCAN) 4 mg per spray nasal spray     oxyCODONE -acetaminophen  (PERCOCET) 10-325 mg Oral tablet Take 1 Tablet by mouth Every 4 hours as needed for Pain for up to 20 days    pantoprazole (PROTONIX) 40 mg Oral Tablet, Delayed Release (E.C.) Take 1 Tablet (40 mg total) by mouth Daily    promethazine (PHENERGAN) 12.5 mg Oral Tablet     zolpidem (AMBIEN) 5 mg Oral Tablet      Allergies   Allergen Reactions    Ciprofloxacin Rash    Nsaids (Non-Steroidal Anti-Inflammatory Drug)        PHYSICAL EXAM:  BP 116/72 (Site: Left Arm, Patient Position: Sitting, Cuff Size: Adult)   Pulse 82   Temp (!) 35.7 C (96.3 F) (Temporal)   Ht 1.626 m (5' 4)   Wt 57.6 kg (127 lb)   SpO2 96%   BMI 21.80 kg/m        ECOG Status: (0) Fully active, able to carry on all predisease performance without restriction     Physical Exam  Vitals reviewed.   Constitutional:       General: She is not in acute distress.     Appearance: Normal appearance.   HENT:      Head: Normocephalic.      Right Ear: External ear normal.      Left Ear: External ear normal.      Nose: Nose normal.      Mouth/Throat:  Mouth: Mucous membranes are moist.      Pharynx: Oropharynx is clear.   Eyes:      General: No scleral icterus.        Right eye: No discharge.         Left eye: No discharge.      Conjunctiva/sclera: Conjunctivae normal.   Cardiovascular:      Rate and Rhythm: Normal rate.      Heart sounds: Normal heart sounds.   Pulmonary:      Effort:  Pulmonary effort is normal. No respiratory distress.      Breath sounds: No wheezing or rhonchi.   Abdominal:      General: Bowel sounds are normal. There is no distension.      Tenderness: There is no abdominal tenderness.   Musculoskeletal:      Cervical back: Normal range of motion.      Right lower leg: No edema.      Left lower leg: No edema.   Lymphadenopathy:      Cervical: No cervical adenopathy.   Skin:     General: Skin is warm and dry.   Neurological:      General: No focal deficit present.      Mental Status: She is alert and oriented to person, place, and time.      Motor: No weakness.      Gait: Gait abnormal.   Psychiatric:         Mood and Affect: Mood normal.         Behavior: Behavior normal.         DIAGNOSTIC DATA:  No results found for this or any previous visit (from the past 82479 hours).    LABS:   CBC  Diff   Lab Results   Component Value Date/Time    WBC 8.7 11/01/2024 02:16 PM    HGB 12.7 11/01/2024 02:16 PM    HCT 37.3 11/01/2024 02:16 PM    PLTCNT 418 11/01/2024 02:16 PM    RBC 3.92 11/01/2024 02:16 PM    MCV 95.3 11/01/2024 02:16 PM    MCHC 33.9 11/01/2024 02:16 PM    MCH 32.3 11/01/2024 02:16 PM    RDW 15.3 11/01/2024 02:16 PM    MPV 6.8 (L) 11/01/2024 02:16 PM    Lab Results   Component Value Date/Time    PMNS 47 11/01/2024 02:16 PM    LYMPHOCYTES 45 11/01/2024 02:16 PM    EOSINOPHIL 1 11/01/2024 02:16 PM    MONOCYTES 6 11/01/2024 02:16 PM    BASOPHILS 2 (H) 11/01/2024 02:16 PM    BASOPHILS 0.10 11/01/2024 02:16 PM    PMNABS 4.10 11/01/2024 02:16 PM    LYMPHSABS 3.90 (H) 11/01/2024 02:16 PM    EOSABS 0.00 11/01/2024 02:16 PM    MONOSABS 0.50 11/01/2024 02:16 PM            Comprehensive Metabolic Profile    Lab Results   Component Value Date    SODIUM 139 11/01/2024    POTASSIUM 4.4 11/01/2024    CHLORIDE 104 11/01/2024    CO2 28 11/01/2024    ANIONGAP 7 11/01/2024    BUN 11 11/01/2024    CREATININE 0.99 11/01/2024    ALBUMIN 4.3 11/01/2024    CALCIUM 9.7 11/01/2024    GLUCOSENF 91  11/01/2024    ALKPHOS 84 11/01/2024    ALT 8 11/01/2024    AST 13 11/01/2024    TOTBILIRUBIN 0.5 11/01/2024    TOTALPROTEIN 8.8 11/01/2024  ASSESSMENT:  Problem List Items Addressed This Visit    None  Visit Diagnoses         Abnormal bone radiograph    -  Primary    Relevant Orders    DEXA BONE DENSITOMETRY    NUC BONE SCAN WHOLE BODY WITH SPECT      Osteoporosis screening        Relevant Orders    DEXA BONE DENSITOMETRY    NUC BONE SCAN WHOLE BODY WITH SPECT               ICD-10-CM    1. Abnormal bone radiograph  R93.7 DEXA BONE DENSITOMETRY     NUC BONE SCAN WHOLE BODY WITH SPECT      2. Osteoporosis screening  Z13.820 DEXA BONE DENSITOMETRY     NUC BONE SCAN WHOLE BODY WITH SPECT      1. T-S/L-S/pelvic osseous lesions s/p 02/24/23 right iliac bone biopsy and 03/15/23 L4 bone biopsy negative for malignancy.      Has associated mod-severe spinal pain. No cord compression by imaging or H&P. A 02/12/23 CT CAP showed only extensive multi focal sclerotic lesions are seen throughout the lumbar spine and pelvis and a small 8 mm hypervascular focus in the right hepatic lobe of unclear significance but likely benign.      A 01/2024 B/L mammogram was reportedly benign.  No breast masses.  She reports a negative colonoscopy about 2 years ago.  A 03/18/22 EGD was unremarkable.     No neurologic complaints.     A 02/24/23 right iliac bone biopsy showed no malignancy.  A 03/15/23 L4 bone biopsy was also negative for malignancy.     A 04/29/23 PET/CT No definite abnormal osseous hypermetabolic finding to correlate with the previously observed sclerotic osseous irregularity and again 04/06/24 Scattered sclerotic skeletal lesions are again seen and do not appear to be hypermetabolic.     A 12/22/23 nuclear bone scan shows Stable appearance since 11/19/2022 with probable degenerative uptake in the spine. The pattern of uptake does not suggest metastatic neoplasm.      2. Osteoporosis.     Started on monthly Denosumab  on 02/18/23 then  changed to every 6 months with last injection on 07/27/24.     3. Mild lymphocytosis stable.  Flow cytometry on 04/25/24 negative.     PLAN:   1. All relevant medical records were reviewed including available pertinent provider notes, procedure notes, imaging, laboratory, and pathology.   2. All pertinent labs and/or imaging were reviewed with the patient.   3. Dexa Scan due after 01/27/25 ordered today.   4. Continue with Prolia  every 6 months for osteoporosis.   5. Case discussed with Dr. Marcey who recommends repeat nuclear bone scan.  6. Smoking cessation encouraged.     7. Follow up in 6 months or sooner if needed.     Alfredo LULLA Kerns was given the chance to ask questions, and these were answered to their satisfaction. The patient is welcome to call with any questions or concerns in the meantime.     On the day of the encounter, a total of 45 minutes was spent on this patient encounter including review of historical information, examination, documentation and post-visit activities.   Return in about 6 months (around 05/02/2025).   Romualdo Moose APRN, FNP-BC 12/17/202519:49    CC:  Earnest Daniels, DO  [address] [facility]  [place] [address]    Daniels Earnest, DO  [address] [facility]  [place],  [place] [address]    This note was partially generated using MModal Fluency Direct system, and there may be some incorrect words, spellings, and punctuation that were not noted in checking the note before saving.

## 2024-11-02 ENCOUNTER — Ambulatory Visit
Admission: RE | Admit: 2024-11-02 | Discharge: 2024-11-02 | Disposition: A | Discharge status: Still In Hospital | Payer: MEDICAID | Source: Ambulatory Visit | Attending: RADIATION ONCOLOGY | Admitting: RADIATION ONCOLOGY

## 2024-11-02 DIAGNOSIS — M898X0 Other specified disorders of bone, multiple sites: Secondary | ICD-10-CM

## 2024-11-02 DIAGNOSIS — Z9889 Other specified postprocedural states: Secondary | ICD-10-CM | POA: Insufficient documentation

## 2024-11-02 DIAGNOSIS — M25551 Pain in right hip: Secondary | ICD-10-CM | POA: Insufficient documentation

## 2024-11-02 DIAGNOSIS — M546 Pain in thoracic spine: Secondary | ICD-10-CM | POA: Insufficient documentation

## 2024-11-02 DIAGNOSIS — M25511 Pain in right shoulder: Secondary | ICD-10-CM | POA: Insufficient documentation

## 2024-11-02 DIAGNOSIS — M545 Low back pain, unspecified: Secondary | ICD-10-CM | POA: Insufficient documentation

## 2024-11-02 DIAGNOSIS — M25512 Pain in left shoulder: Secondary | ICD-10-CM | POA: Insufficient documentation

## 2024-11-02 DIAGNOSIS — M25552 Pain in left hip: Secondary | ICD-10-CM | POA: Insufficient documentation

## 2024-11-02 MED ORDER — OXYCODONE-ACETAMINOPHEN 10 MG-325 MG TABLET: 1.0000 | ORAL_TABLET | ORAL | 0 refills | Status: DC | PRN

## 2024-11-02 NOTE — Addendum Note (Signed)
 Encounter addended by: Cherylene Pac, MD on: 11/02/2024 1:33 PM   Actions taken: Actions taken from an [employer] Advisory, Order list changed

## 2024-11-02 NOTE — Progress Notes (Signed)
 Name: Rebecca Wall MRN:  [id number]   Date: 11/02/2024 Age: 63 y.o.       TUMOR:  Suspected metastatic cancer    TREATMENT INTERVAL:  No prior treatment    INTERVAL ONCOLOGIC HISTORY:  Rebecca, Wall is a pleasant 63 year old who has been under workup for suspected metastatic cancer.  She continues to have severe bone pain and sees Dr. Moses and Dr. Marcey.  She has had bone marrow biopsy and multiple tests that have failed to show any evidence of cancer.  Her prior scans have showed very worrisome findings in the bones.  She continues to have severe pain worse in her shoulders and hips.  She has had a PET-CT scan that shows no new signs of uptake.  Her previously noted bony abnormalities are stable.  She has had previous joint injections in multiple joints including her shoulders hips.  These have had no positive impact on her pain.      She returns in follow up with stable complaints of low back pain in upper back pain.  She continues to see Dr. Marcey.    PAST MEDICAL HISTORY    Past Medical History:   Diagnosis Date    Coronary artery disease     Depression     Essential hypertension     Generalized anxiety disorder     HLD (hyperlipidemia)         ALLERGIES:   Allergies   Allergen Reactions    Ciprofloxacin Rash    Nsaids (Non-Steroidal Anti-Inflammatory Drug)         MEDICATIONS:   Current Outpatient Medications   Medication Instructions    amLODIPine (NORVASC) 5 mg, Daily    aspirin (ECOTRIN) 81 mg, Daily    atorvastatin (LIPITOR) 20 mg, Daily    budesonide-formoteroL (SYMBICORT) 160-4.5 mcg/actuation Inhalation oral inhaler 2 Puffs, 2 TIMES DAILY    calcium carbonate (CALCIUM 600) 600 mg, 2 TIMES DAILY    diclofenac sodium (VOLTAREN) 1 % Gel 4 TIMES DAILY    droNABinol (MARINOL) 2.5 mg Oral Capsule     ergocalciferol , vitamin D2, (DRISDOL) 1,250 mcg (50,000 unit) Oral Capsule     ferrous sulfate 325 mg (65 mg iron) Oral Tablet, Delayed Release (E.C.)     folic acid  (FOLVITE ) 1 mg, Daily     hydrOXYzine (ATARAX) 10 mg, 3 TIMES DAILY PRN    methotrexate 12.5 mg, EVERY 7 DAYS    naloxone (NARCAN) 4 mg per spray nasal spray     pantoprazole (PROTONIX) 40 mg, Daily    promethazine (PHENERGAN) 12.5 mg Oral Tablet     zolpidem (AMBIEN) 5 mg Oral Tablet         INTERVAL SURGERIES:   Past Surgical History:   Procedure Laterality Date    CARDIAC CATHETERIZATION  07/29/2015    HX CHOLECYSTECTOMY      HX COLONOSCOPY      HX NEPHRECTOMY      HX ROTATOR CUFF REPAIR  10/18/2014    SPLENECTOMY, TOTAL          FAMILY HISTORY: Reviewed and unchanged    SOCIAL HISTORY: Reviewed and unchanged    REVIEW OF SYSTEMS  Pertinent review of systems as discussed in interval oncological history      PHYSICAL EXAMINATION  Physical Exam  Eyes:      Pupils: Pupils are equal, round, and reactive to light.   Neurological:      General: No focal deficit present.      Mental  Status: She is alert and oriented to person, place, and time.   Psychiatric:         Mood and Affect: Mood normal.       ASSESSMENT & PLAN  This patient continues to have severe pain.  She has good days and bad days.  I will continue her pain medication.  We will have her follow up in 1 month.  Time with patient 20 minutes.    Rebecca Patten, MD  11/02/2024 10:23     rr:[email or web address]@

## 2024-11-03 ENCOUNTER — Encounter (HOSPITAL_COMMUNITY): Payer: Self-pay | Admitting: HEMATOLOGY-ONCOLOGY

## 2024-11-03 NOTE — Addendum Note (Signed)
 Addended by: MARANDA ROMUALDO HERO on: 11/03/2024 08:56 AM     Modules accepted: Orders

## 2024-12-04 ENCOUNTER — Ambulatory Visit (HOSPITAL_COMMUNITY): Payer: MEDICAID

## 2024-12-04 ENCOUNTER — Ambulatory Visit: Payer: MEDICAID

## 2024-12-07 ENCOUNTER — Ambulatory Visit
Admission: RE | Admit: 2024-12-07 | Discharge: 2024-12-07 | Disposition: A | Discharge status: Still In Hospital | Payer: MEDICAID | Source: Ambulatory Visit | Attending: RADIATION ONCOLOGY | Admitting: RADIATION ONCOLOGY

## 2024-12-07 ENCOUNTER — Other Ambulatory Visit: Payer: Self-pay

## 2024-12-07 DIAGNOSIS — M25511 Pain in right shoulder: Secondary | ICD-10-CM | POA: Insufficient documentation

## 2024-12-07 DIAGNOSIS — M898X9 Other specified disorders of bone, unspecified site: Secondary | ICD-10-CM | POA: Insufficient documentation

## 2024-12-07 DIAGNOSIS — M542 Cervicalgia: Secondary | ICD-10-CM | POA: Insufficient documentation

## 2024-12-07 DIAGNOSIS — M25551 Pain in right hip: Secondary | ICD-10-CM | POA: Insufficient documentation

## 2024-12-07 DIAGNOSIS — M25512 Pain in left shoulder: Secondary | ICD-10-CM | POA: Insufficient documentation

## 2024-12-07 DIAGNOSIS — M25552 Pain in left hip: Secondary | ICD-10-CM | POA: Insufficient documentation

## 2024-12-07 MED ORDER — OXYCODONE-ACETAMINOPHEN 10 MG-325 MG TABLET: 1.0000 | ORAL_TABLET | ORAL | 0 refills | Status: AC | PRN

## 2024-12-07 NOTE — Progress Notes (Signed)
 Name: Rebecca Wall MRN:  [id number]   Date: 12/07/2024 Age: 64 y.o.       TUMOR:  Suspected metastatic cancer    TREATMENT INTERVAL:  No prior treatment    INTERVAL ONCOLOGIC HISTORY:  Rebecca Wall, Rebecca Wall is a pleasant 64 year old who has been under workup for suspected metastatic cancer.  She continues to have severe bone pain and sees Dr. Moses and Dr. Marcey.  She has had bone marrow biopsy and multiple tests that have failed to show any evidence of cancer.  Her prior scans have showed very worrisome findings in the bones.  She continues to have severe pain worse in her shoulders and hips.  She is scheduled for an upcoming bone scan.  She has severe neck pain at this time.    PAST MEDICAL HISTORY    Past Medical History:   Diagnosis Date    Coronary artery disease     Depression     Essential hypertension     Generalized anxiety disorder     HLD (hyperlipidemia)         ALLERGIES:   Allergies   Allergen Reactions    Ciprofloxacin Rash    Nsaids (Non-Steroidal Anti-Inflammatory Drug)         MEDICATIONS:   Current Outpatient Medications   Medication Instructions    amLODIPine (NORVASC) 5 mg, Daily    aspirin (ECOTRIN) 81 mg, Daily    atorvastatin (LIPITOR) 20 mg, Daily    budesonide-formoteroL (SYMBICORT) 160-4.5 mcg/actuation Inhalation oral inhaler 2 Puffs, 2 TIMES DAILY    calcium carbonate (CALCIUM 600) 600 mg, 2 TIMES DAILY    diclofenac sodium (VOLTAREN) 1 % Gel 4 TIMES DAILY    droNABinol (MARINOL) 2.5 mg Oral Capsule     ergocalciferol , vitamin D2, (DRISDOL) 1,250 mcg (50,000 unit) Oral Capsule     ferrous sulfate 325 mg (65 mg iron) Oral Tablet, Delayed Release (E.C.)     folic acid  (FOLVITE ) 1 mg, Daily    hydrOXYzine (ATARAX) 10 mg, 3 TIMES DAILY PRN    methotrexate 12.5 mg, EVERY 7 DAYS    naloxone (NARCAN) 4 mg per spray nasal spray     oxyCODONE -acetaminophen  (PERCOCET) 10-325 mg Oral tablet 1 Tablet, Oral, EVERY 4 HOURS PRN    pantoprazole (PROTONIX) 40 mg, Daily    promethazine (PHENERGAN)  12.5 mg Oral Tablet     zolpidem (AMBIEN) 5 mg Oral Tablet         INTERVAL SURGERIES:   Past Surgical History:   Procedure Laterality Date    CARDIAC CATHETERIZATION  07/29/2015    HX CHOLECYSTECTOMY      HX COLONOSCOPY      HX NEPHRECTOMY      HX ROTATOR CUFF REPAIR  10/18/2014    SPLENECTOMY, TOTAL          FAMILY HISTORY: Reviewed and unchanged    SOCIAL HISTORY: Reviewed and unchanged    REVIEW OF SYSTEMS  Pertinent review of systems as discussed in interval oncological history      PHYSICAL EXAMINATION  Physical Exam  Eyes:      Pupils: Pupils are equal, round, and reactive to light.   Neurological:      General: No focal deficit present.      Mental Status: She is alert and oriented to person, place, and time.   Psychiatric:         Mood and Affect: Mood normal.       ASSESSMENT & PLAN  This patient continues  to have severe pain.  She has good days and bad days.  I will continue her pain medication.  We will have her follow up in 1 month.  We will consider MRI of the C-spine at that time if she continues to have severe neck pain.  Time with patient 20 minutes.    Rebecca Patten, MD  12/07/2024 10:34     rr:[email or web address]@

## 2024-12-08 ENCOUNTER — Ambulatory Visit (HOSPITAL_BASED_OUTPATIENT_CLINIC_OR_DEPARTMENT_OTHER): Payer: MEDICAID | Admitting: RADIATION ONCOLOGY

## 2024-12-11 ENCOUNTER — Ambulatory Visit (HOSPITAL_COMMUNITY): Payer: MEDICAID

## 2024-12-11 ENCOUNTER — Ambulatory Visit: Payer: MEDICAID

## 2024-12-13 ENCOUNTER — Other Ambulatory Visit (HOSPITAL_BASED_OUTPATIENT_CLINIC_OR_DEPARTMENT_OTHER): Payer: Self-pay | Admitting: RADIATION ONCOLOGY

## 2024-12-13 MED ORDER — GABAPENTIN 400 MG CAPSULE: 400.0000 mg | ORAL_CAPSULE | Freq: Three times a day (TID) | ORAL | 2 refills | Status: AC

## 2024-12-20 ENCOUNTER — Ambulatory Visit: Payer: MEDICAID

## 2024-12-20 ENCOUNTER — Ambulatory Visit (HOSPITAL_COMMUNITY): Payer: MEDICAID

## 2024-12-26 ENCOUNTER — Ambulatory Visit (HOSPITAL_COMMUNITY): Payer: MEDICAID

## 2024-12-26 ENCOUNTER — Ambulatory Visit: Payer: MEDICAID

## 2025-01-05 ENCOUNTER — Ambulatory Visit: Payer: MEDICAID | Attending: RADIATION ONCOLOGY | Admitting: RADIATION ONCOLOGY

## 2025-01-25 ENCOUNTER — Ambulatory Visit: Payer: MEDICAID

## 2025-01-29 ENCOUNTER — Ambulatory Visit: Payer: MEDICAID

## 2025-05-09 ENCOUNTER — Ambulatory Visit (INDEPENDENT_AMBULATORY_CARE_PROVIDER_SITE_OTHER): Payer: Self-pay | Admitting: NURSE PRACTITIONER
